# Patient Record
Sex: Female | Born: 1965 | Race: White | Hispanic: No | Marital: Married | Smoking: Current every day smoker
Health system: Southern US, Community
[De-identification: ages and names within clinical notes are randomized; demographics above are authoritative.]

## PROBLEM LIST (undated history)

## (undated) DIAGNOSIS — I10 Essential (primary) hypertension: Secondary | ICD-10-CM

## (undated) DIAGNOSIS — M67439 Ganglion, unspecified wrist: Secondary | ICD-10-CM

## (undated) DIAGNOSIS — E039 Hypothyroidism, unspecified: Secondary | ICD-10-CM

## (undated) DIAGNOSIS — M1990 Unspecified osteoarthritis, unspecified site: Secondary | ICD-10-CM

## (undated) DIAGNOSIS — D497 Neoplasm of unspecified behavior of endocrine glands and other parts of nervous system: Secondary | ICD-10-CM

## (undated) DIAGNOSIS — F32A Depression, unspecified: Secondary | ICD-10-CM

## (undated) DIAGNOSIS — K219 Gastro-esophageal reflux disease without esophagitis: Secondary | ICD-10-CM

## (undated) DIAGNOSIS — R0600 Dyspnea, unspecified: Secondary | ICD-10-CM

## (undated) DIAGNOSIS — F419 Anxiety disorder, unspecified: Secondary | ICD-10-CM

## (undated) HISTORY — PX: BREAST SURGERY: SHX581

## (undated) HISTORY — PX: BREAST BIOPSY: SHX20

---

## 1993-07-23 HISTORY — PX: CHOLECYSTECTOMY: SHX55

## 1996-07-23 HISTORY — PX: TUBAL LIGATION: SHX77

## 2013-08-14 ENCOUNTER — Other Ambulatory Visit: Payer: Self-pay | Admitting: Neurosurgery

## 2013-08-17 ENCOUNTER — Encounter (HOSPITAL_COMMUNITY): Payer: Self-pay | Admitting: Pharmacy Technician

## 2013-08-18 NOTE — Pre-Procedure Instructions (Signed)
[name]  08/18/2013   Your procedure is scheduled on:  Jan. 30th, Friday   Report to [place]  2 * 3 at 5:30 AM.  Call this number if you have problems the morning of surgery: [phone]   Remember:   Do not eat food or drink liquids after midnight Thursday.   Take these medicines the morning of surgery with A SIP OF WATER: hydrocodone   Do not wear jewelry, make-up or nail polish.  Do not wear lotions, powders, or perfumes. You may NOT wear deodorant.  Do not shave underarms & legs 48 hours prior to surgery.    Do not bring valuables to the hospital.  [facility] is not responsible for any belongings or valuables.               Contacts, dentures or bridgework may not be worn into surgery.  Leave suitcase in the car. After surgery it may be brought to your room.  For patients admitted to the hospital, discharge time is determined by your treatment team.              Name and phone number of your driver:     Special Instructions: Shower using CHG 2 nights before surgery and the night before surgery.  If you shower the day of surgery use CHG.  Use special wash - you have one bottle of CHG for all showers.  You should use approximately 1/3 of the bottle for each shower.   Please read over the following fact sheets that you were given: Pain Booklet, Coughing and Deep Breathing and Surgical Site Infection Prevention

## 2013-08-19 ENCOUNTER — Encounter (HOSPITAL_COMMUNITY)
Admission: RE | Admit: 2013-08-19 | Discharge: 2013-08-19 | Disposition: A | Discharge status: Home / Self Care | Payer: BC Managed Care – PPO | Source: Ambulatory Visit | Attending: Anesthesiology | Admitting: Anesthesiology

## 2013-08-19 ENCOUNTER — Encounter (HOSPITAL_COMMUNITY)
Admission: RE | Admit: 2013-08-19 | Discharge: 2013-08-19 | Disposition: A | Discharge status: Home / Self Care | Payer: BC Managed Care – PPO | Source: Ambulatory Visit | Attending: Neurosurgery | Admitting: Neurosurgery

## 2013-08-19 ENCOUNTER — Encounter (HOSPITAL_COMMUNITY): Payer: Self-pay

## 2013-08-19 DIAGNOSIS — Z01810 Encounter for preprocedural cardiovascular examination: Secondary | ICD-10-CM | POA: Insufficient documentation

## 2013-08-19 DIAGNOSIS — Z01811 Encounter for preprocedural respiratory examination: Secondary | ICD-10-CM | POA: Insufficient documentation

## 2013-08-19 DIAGNOSIS — Z01812 Encounter for preprocedural laboratory examination: Secondary | ICD-10-CM | POA: Insufficient documentation

## 2013-08-19 DIAGNOSIS — Z01818 Encounter for other preprocedural examination: Secondary | ICD-10-CM | POA: Insufficient documentation

## 2013-08-19 HISTORY — DX: Ganglion, unspecified wrist: M67.439

## 2013-08-19 HISTORY — DX: Essential (primary) hypertension: I10

## 2013-08-19 HISTORY — DX: Neoplasm of unspecified behavior of endocrine glands and other parts of nervous system: D49.7

## 2013-08-19 LAB — HCG, SERUM, QUALITATIVE: PREG SERUM: NEGATIVE

## 2013-08-19 LAB — SURGICAL PCR SCREEN
MRSA, PCR: NEGATIVE
Staphylococcus aureus: POSITIVE — AB

## 2013-08-19 LAB — CBC WITH DIFFERENTIAL/PLATELET
BASOS ABS: 0.1 10*3/uL (ref 0.0–0.1)
Basophils Relative: 1 % (ref 0–1)
Eosinophils Absolute: 0.1 10*3/uL (ref 0.0–0.7)
Eosinophils Relative: 1 % (ref 0–5)
HCT: 44.4 % (ref 36.0–46.0)
Hemoglobin: 15.2 g/dL — ABNORMAL HIGH (ref 12.0–15.0)
Lymphocytes Relative: 23 % (ref 12–46)
Lymphs Abs: 2.3 10*3/uL (ref 0.7–4.0)
MCH: 30.3 pg (ref 26.0–34.0)
MCHC: 34.2 g/dL (ref 30.0–36.0)
MCV: 88.6 fL (ref 78.0–100.0)
MONO ABS: 0.6 10*3/uL (ref 0.1–1.0)
Monocytes Relative: 6 % (ref 3–12)
Neutro Abs: 6.7 10*3/uL (ref 1.7–7.7)
Neutrophils Relative %: 69 % (ref 43–77)
Platelets: 262 10*3/uL (ref 150–400)
RBC: 5.01 MIL/uL (ref 3.87–5.11)
RDW: 12.6 % (ref 11.5–15.5)
WBC: 9.7 10*3/uL (ref 4.0–10.5)

## 2013-08-19 LAB — BASIC METABOLIC PANEL
BUN: 5 mg/dL — ABNORMAL LOW (ref 6–23)
CHLORIDE: 102 meq/L (ref 96–112)
CO2: 28 mEq/L (ref 19–32)
CREATININE: 0.64 mg/dL (ref 0.50–1.10)
Calcium: 9.2 mg/dL (ref 8.4–10.5)
GFR calc non Af Amer: >90 mL/min (ref >90)
Glucose, Bld: 81 mg/dL (ref 70–99)
POTASSIUM: 4.8 meq/L (ref 3.7–5.3)
Sodium: 141 mEq/L (ref 137–147)

## 2013-08-19 NOTE — Progress Notes (Addendum)
Anesthesia Chart Review:  Patient is a 48 year old female scheduled for L4-5 laminectomy for intradural tumor on 08/21/13 by Dr. Annette Stable.  Other history includes smoking, cholecystectomy, benign breast biopsy, tubal ligation, HTN in pregnancy.  She does not see a PCP on a regular basis, but has been seen at [facility] in [place] within the past three years.  Her BP at PAT was consistently elevated 170/100 - 185/118. Dr. Rupert Stacks office notified and he will see her tomorrow at 2 PM.  Lorriane Shire at Dr. Marchelle Folks office notified.   Preoperative CXR and labs noted. EKG on 08/19/13 showed NSR, possible LAE.    Plans to proceed with depend on Dr. Rupert Stacks input and BP control.    George Hugh [facility] Short Stay Center/Anesthesiology Phone [phone] 08/19/2013 6:34 PM  Addendum: 08/20/2013 4:55 PM I spoke with Lorriane Shire earlier today.  Dr. Annette Stable is aware of patient's elevated BP at PAT and wants to keep case as scheduled with plans to re-evaluate on arrival.  In addition, I just received a fax from [place] from Dr. Rupert Stacks office.  Note states, "I have spoken with Dr. Annette Stable. Cleared for non-cardiac surgery.  New onset HTN--no treatment started at this time.  Suspect surgery to remove spinal mass will resolve. F/U after surgery."

## 2013-08-19 NOTE — Progress Notes (Signed)
Appointment made 08/20/1013 @ 2pm with Dr Lonzo Cloud in [place]; for evaluation of high blood pressure and surgical clearance. [facility] @ [facility]  office informed. Shelby Dubin., PA aware.

## 2013-08-20 MED ORDER — CEFAZOLIN SODIUM-DEXTROSE 2-3 GM-% IV SOLR
2.0000 g | INTRAVENOUS | Status: AC
  Administered 2013-08-21: 2 g via INTRAVENOUS
  Filled 2013-08-20: qty 50

## 2013-08-21 ENCOUNTER — Inpatient Hospital Stay (HOSPITAL_COMMUNITY): Payer: BC Managed Care – PPO

## 2013-08-21 ENCOUNTER — Encounter (HOSPITAL_COMMUNITY): Payer: BC Managed Care – PPO | Admitting: Vascular Surgery

## 2013-08-21 ENCOUNTER — Encounter (HOSPITAL_COMMUNITY): Payer: Self-pay | Admitting: *Deleted

## 2013-08-21 ENCOUNTER — Inpatient Hospital Stay (HOSPITAL_COMMUNITY): Payer: BC Managed Care – PPO | Admitting: Anesthesiology

## 2013-08-21 ENCOUNTER — Inpatient Hospital Stay (HOSPITAL_COMMUNITY)
Admission: RE | Admit: 2013-08-21 | Discharge: 2013-08-25 | DRG: 029 | Disposition: A | Discharge status: Home / Self Care | Payer: BC Managed Care – PPO | Source: Ambulatory Visit | Attending: Neurosurgery | Admitting: Neurosurgery

## 2013-08-21 ENCOUNTER — Encounter (HOSPITAL_COMMUNITY)
Admission: RE | Disposition: A | Discharge status: Home / Self Care | Payer: Self-pay | Source: Ambulatory Visit | Attending: Neurosurgery

## 2013-08-21 DIAGNOSIS — Z79899 Other long term (current) drug therapy: Secondary | ICD-10-CM

## 2013-08-21 DIAGNOSIS — Z01818 Encounter for other preprocedural examination: Secondary | ICD-10-CM

## 2013-08-21 DIAGNOSIS — F17200 Nicotine dependence, unspecified, uncomplicated: Secondary | ICD-10-CM | POA: Diagnosis present

## 2013-08-21 DIAGNOSIS — Z01812 Encounter for preprocedural laboratory examination: Secondary | ICD-10-CM

## 2013-08-21 DIAGNOSIS — D497 Neoplasm of unspecified behavior of endocrine glands and other parts of nervous system: Secondary | ICD-10-CM | POA: Diagnosis present

## 2013-08-21 DIAGNOSIS — Z01811 Encounter for preprocedural respiratory examination: Secondary | ICD-10-CM

## 2013-08-21 DIAGNOSIS — R32 Unspecified urinary incontinence: Secondary | ICD-10-CM | POA: Diagnosis present

## 2013-08-21 DIAGNOSIS — Z01810 Encounter for preprocedural cardiovascular examination: Secondary | ICD-10-CM

## 2013-08-21 DIAGNOSIS — G834 Cauda equina syndrome: Secondary | ICD-10-CM | POA: Diagnosis present

## 2013-08-21 DIAGNOSIS — D334 Benign neoplasm of spinal cord: Principal | ICD-10-CM | POA: Diagnosis present

## 2013-08-21 DIAGNOSIS — I10 Essential (primary) hypertension: Secondary | ICD-10-CM | POA: Diagnosis present

## 2013-08-21 DIAGNOSIS — IMO0002 Reserved for concepts with insufficient information to code with codable children: Secondary | ICD-10-CM

## 2013-08-21 HISTORY — PX: LAMINECTOMY: SHX219

## 2013-08-21 SURGERY — LUMBAR LAMINECTOMY FOR TUMOR
Anesthesia: General | Site: Back

## 2013-08-21 MED ORDER — STERILE WATER FOR INJECTION IJ SOLN
INTRAMUSCULAR | Status: AC
  Filled 2013-08-21: qty 10

## 2013-08-21 MED ORDER — ALUM & MAG HYDROXIDE-SIMETH 200-200-20 MG/5ML PO SUSP: 30.0000 mL | Freq: Four times a day (QID) | ORAL | Status: DC | PRN

## 2013-08-21 MED ORDER — MIDAZOLAM HCL 5 MG/5ML IJ SOLN
INTRAMUSCULAR | Status: DC | PRN
  Administered 2013-08-21: 2 mg via INTRAVENOUS

## 2013-08-21 MED ORDER — HYDROCODONE-ACETAMINOPHEN 5-325 MG PO TABS
1.0000 | ORAL_TABLET | ORAL | Status: DC | PRN
  Administered 2013-08-25: 2 via ORAL
  Filled 2013-08-21: qty 2

## 2013-08-21 MED ORDER — METOCLOPRAMIDE HCL 5 MG/ML IJ SOLN: 10.0000 mg | Freq: Once | INTRAMUSCULAR | Status: DC | PRN

## 2013-08-21 MED ORDER — SUFENTANIL CITRATE 50 MCG/ML IV SOLN
INTRAVENOUS | Status: DC | PRN
  Administered 2013-08-21: 10 ug via INTRAVENOUS
  Administered 2013-08-21: 20 ug via INTRAVENOUS

## 2013-08-21 MED ORDER — HYDROMORPHONE HCL PF 1 MG/ML IJ SOLN
0.2500 mg | INTRAMUSCULAR | Status: DC | PRN
  Administered 2013-08-21 (×2): 0.5 mg via INTRAVENOUS

## 2013-08-21 MED ORDER — 0.9 % SODIUM CHLORIDE (POUR BTL) OPTIME
TOPICAL | Status: DC | PRN
  Administered 2013-08-21: 1000 mL

## 2013-08-21 MED ORDER — HYDROMORPHONE HCL PF 1 MG/ML IJ SOLN
0.5000 mg | INTRAMUSCULAR | Status: DC | PRN
  Administered 2013-08-21: 1 mg via INTRAVENOUS
  Filled 2013-08-21: qty 1

## 2013-08-21 MED ORDER — SENNA 8.6 MG PO TABS
1.0000 | ORAL_TABLET | Freq: Two times a day (BID) | ORAL | Status: DC
  Administered 2013-08-21 – 2013-08-25 (×7): 8.6 mg via ORAL
  Filled 2013-08-21 (×9): qty 1

## 2013-08-21 MED ORDER — DEXAMETHASONE SODIUM PHOSPHATE 10 MG/ML IJ SOLN: 10.0000 mg | INTRAMUSCULAR | Status: DC

## 2013-08-21 MED ORDER — THROMBIN 20000 UNITS EX SOLR
CUTANEOUS | Status: DC | PRN
  Administered 2013-08-21: 09:00:00 via TOPICAL

## 2013-08-21 MED ORDER — SODIUM CHLORIDE 0.9 % IJ SOLN: 3.0000 mL | INTRAMUSCULAR | Status: DC | PRN

## 2013-08-21 MED ORDER — ONDANSETRON HCL 4 MG/2ML IJ SOLN
INTRAMUSCULAR | Status: AC
  Filled 2013-08-21: qty 2

## 2013-08-21 MED ORDER — OXYCODONE HCL 5 MG PO TABS
ORAL_TABLET | ORAL | Status: AC
  Filled 2013-08-21: qty 1

## 2013-08-21 MED ORDER — BISACODYL 10 MG RE SUPP: 10.0000 mg | Freq: Every day | RECTAL | Status: DC | PRN

## 2013-08-21 MED ORDER — OXYCODONE-ACETAMINOPHEN 5-325 MG PO TABS
1.0000 | ORAL_TABLET | ORAL | Status: DC | PRN
  Administered 2013-08-22 (×2): 1 via ORAL
  Administered 2013-08-23 – 2013-08-24 (×2): 2 via ORAL
  Filled 2013-08-21 (×3): qty 1
  Filled 2013-08-21 (×2): qty 2

## 2013-08-21 MED ORDER — PHENOL 1.4 % MT LIQD: 1.0000 | OROMUCOSAL | Status: DC | PRN

## 2013-08-21 MED ORDER — MENTHOL 3 MG MT LOZG: 1.0000 | LOZENGE | OROMUCOSAL | Status: DC | PRN

## 2013-08-21 MED ORDER — SODIUM CHLORIDE 0.9 % IV SOLN: 250.0000 mL | INTRAVENOUS | Status: DC

## 2013-08-21 MED ORDER — CYCLOBENZAPRINE HCL 10 MG PO TABS: 10.0000 mg | ORAL_TABLET | Freq: Three times a day (TID) | ORAL | Status: DC | PRN

## 2013-08-21 MED ORDER — SUCCINYLCHOLINE CHLORIDE 20 MG/ML IJ SOLN
INTRAMUSCULAR | Status: AC
  Filled 2013-08-21: qty 1

## 2013-08-21 MED ORDER — SODIUM CHLORIDE 0.9 % IR SOLN
Status: DC | PRN
  Administered 2013-08-21: 09:00:00

## 2013-08-21 MED ORDER — KETOROLAC TROMETHAMINE 30 MG/ML IJ SOLN
INTRAMUSCULAR | Status: AC
  Filled 2013-08-21: qty 1

## 2013-08-21 MED ORDER — PROPOFOL 10 MG/ML IV BOLUS
INTRAVENOUS | Status: DC | PRN
  Administered 2013-08-21: 200 mg via INTRAVENOUS

## 2013-08-21 MED ORDER — ONDANSETRON HCL 4 MG/2ML IJ SOLN
4.0000 mg | INTRAMUSCULAR | Status: DC | PRN
  Administered 2013-08-21 – 2013-08-22 (×3): 4 mg via INTRAVENOUS
  Filled 2013-08-21 (×3): qty 2

## 2013-08-21 MED ORDER — SUFENTANIL CITRATE 50 MCG/ML IV SOLN
INTRAVENOUS | Status: AC
  Filled 2013-08-21: qty 1

## 2013-08-21 MED ORDER — KETOROLAC TROMETHAMINE 30 MG/ML IJ SOLN
30.0000 mg | Freq: Four times a day (QID) | INTRAMUSCULAR | Status: DC
  Administered 2013-08-21 – 2013-08-25 (×15): 30 mg via INTRAVENOUS
  Filled 2013-08-21 (×23): qty 1

## 2013-08-21 MED ORDER — ROCURONIUM BROMIDE 50 MG/5ML IV SOLN
INTRAVENOUS | Status: AC
  Filled 2013-08-21: qty 1

## 2013-08-21 MED ORDER — PROPOFOL 10 MG/ML IV BOLUS
INTRAVENOUS | Status: AC
  Filled 2013-08-21: qty 20

## 2013-08-21 MED ORDER — OXYCODONE HCL 5 MG PO TABS
5.0000 mg | ORAL_TABLET | Freq: Once | ORAL | Status: AC | PRN
  Administered 2013-08-21: 5 mg via ORAL

## 2013-08-21 MED ORDER — DEXAMETHASONE SODIUM PHOSPHATE 10 MG/ML IJ SOLN
INTRAMUSCULAR | Status: AC
  Filled 2013-08-21: qty 1

## 2013-08-21 MED ORDER — BUPIVACAINE HCL (PF) 0.25 % IJ SOLN
INTRAMUSCULAR | Status: DC | PRN
  Administered 2013-08-21: 30 mL

## 2013-08-21 MED ORDER — PHENYLEPHRINE 40 MCG/ML (10ML) SYRINGE FOR IV PUSH (FOR BLOOD PRESSURE SUPPORT)
PREFILLED_SYRINGE | INTRAVENOUS | Status: AC
  Filled 2013-08-21: qty 10

## 2013-08-21 MED ORDER — LACTATED RINGERS IV SOLN
INTRAVENOUS | Status: DC | PRN
  Administered 2013-08-21 (×2): via INTRAVENOUS

## 2013-08-21 MED ORDER — ACETAMINOPHEN 650 MG RE SUPP: 650.0000 mg | RECTAL | Status: DC | PRN

## 2013-08-21 MED ORDER — POLYETHYLENE GLYCOL 3350 17 G PO PACK
17.0000 g | PACK | Freq: Every day | ORAL | Status: DC | PRN
  Filled 2013-08-21: qty 1

## 2013-08-21 MED ORDER — OXYCODONE HCL 5 MG/5ML PO SOLN: 5.0000 mg | Freq: Once | ORAL | Status: AC | PRN

## 2013-08-21 MED ORDER — KETOROLAC TROMETHAMINE 30 MG/ML IJ SOLN
INTRAMUSCULAR | Status: DC | PRN
  Administered 2013-08-21: 30 mg via INTRAVENOUS

## 2013-08-21 MED ORDER — SODIUM CHLORIDE 0.9 % IJ SOLN
3.0000 mL | Freq: Two times a day (BID) | INTRAMUSCULAR | Status: DC
  Administered 2013-08-21 – 2013-08-24 (×2): 3 mL via INTRAVENOUS

## 2013-08-21 MED ORDER — ONDANSETRON HCL 4 MG/2ML IJ SOLN
INTRAMUSCULAR | Status: DC | PRN
Start: 2013-08-21 — End: 2013-08-21
  Administered 2013-08-21: 4 mg via INTRAVENOUS

## 2013-08-21 MED ORDER — MIDAZOLAM HCL 2 MG/2ML IJ SOLN
INTRAMUSCULAR | Status: AC
  Filled 2013-08-21: qty 2

## 2013-08-21 MED ORDER — CEFAZOLIN SODIUM 1-5 GM-% IV SOLN
1.0000 g | Freq: Three times a day (TID) | INTRAVENOUS | Status: AC
  Administered 2013-08-21 – 2013-08-22 (×2): 1 g via INTRAVENOUS
  Filled 2013-08-21 (×3): qty 50

## 2013-08-21 MED ORDER — LIDOCAINE HCL (CARDIAC) 20 MG/ML IV SOLN
INTRAVENOUS | Status: AC
  Filled 2013-08-21: qty 5

## 2013-08-21 MED ORDER — ACETAMINOPHEN 325 MG PO TABS
650.0000 mg | ORAL_TABLET | ORAL | Status: DC | PRN
  Administered 2013-08-21: 650 mg via ORAL
  Filled 2013-08-21: qty 2

## 2013-08-21 MED ORDER — FLEET ENEMA 7-19 GM/118ML RE ENEM: 1.0000 | ENEMA | Freq: Once | RECTAL | Status: AC | PRN

## 2013-08-21 MED ORDER — HYDROMORPHONE HCL PF 1 MG/ML IJ SOLN
INTRAMUSCULAR | Status: AC
  Filled 2013-08-21: qty 1

## 2013-08-21 MED ORDER — ROCURONIUM BROMIDE 100 MG/10ML IV SOLN
INTRAVENOUS | Status: DC | PRN
  Administered 2013-08-21: 50 mg via INTRAVENOUS

## 2013-08-21 MED ORDER — LIDOCAINE HCL (CARDIAC) 20 MG/ML IV SOLN
INTRAVENOUS | Status: DC | PRN
  Administered 2013-08-21: 40 mg via INTRAVENOUS

## 2013-08-21 MED ORDER — POTASSIUM CHLORIDE IN NACL 20-0.9 MEQ/L-% IV SOLN
INTRAVENOUS | Status: DC
  Administered 2013-08-21 – 2013-08-23 (×5): via INTRAVENOUS
  Filled 2013-08-21 (×11): qty 1000

## 2013-08-21 SURGICAL SUPPLY — 79 items
BAG DECANTER FOR FLEXI CONT (MISCELLANEOUS) ×3 IMPLANT
BENZOIN TINCTURE PRP APPL 2/3 (GAUZE/BANDAGES/DRESSINGS) ×3 IMPLANT
BLADE SURG 11 STRL SS (BLADE) IMPLANT
BLADE SURG ROTATE 9660 (MISCELLANEOUS) IMPLANT
BRUSH SCRUB EZ PLAIN DRY (MISCELLANEOUS) ×3 IMPLANT
BUR CUTTER 7.0 ROUND (BURR) IMPLANT
BUR MATCHSTICK NEURO 3.0 LAGG (BURR) IMPLANT
CANISTER SUCT 3000ML (MISCELLANEOUS) ×3 IMPLANT
CLOSURE WOUND 1/2 X4 (GAUZE/BANDAGES/DRESSINGS) ×1
CONT SPEC 4OZ CLIKSEAL STRL BL (MISCELLANEOUS) ×3 IMPLANT
DERMABOND ADHESIVE PROPEN (GAUZE/BANDAGES/DRESSINGS) ×2
DERMABOND ADVANCED (GAUZE/BANDAGES/DRESSINGS) ×2
DERMABOND ADVANCED .7 DNX12 (GAUZE/BANDAGES/DRESSINGS) ×1 IMPLANT
DERMABOND ADVANCED .7 DNX6 (GAUZE/BANDAGES/DRESSINGS) ×1 IMPLANT
DRAPE LAPAROTOMY 100X72X124 (DRAPES) ×3 IMPLANT
DRAPE LAPAROTOMY T 102X78X121 (DRAPES) IMPLANT
DRAPE MICROSCOPE ZEISS OPMI (DRAPES) ×3 IMPLANT
DRAPE POUCH INSTRU U-SHP 10X18 (DRAPES) ×3 IMPLANT
DRAPE SURG 17X23 STRL (DRAPES) ×6 IMPLANT
DRSG OPSITE POSTOP 4X6 (GAUZE/BANDAGES/DRESSINGS) ×3 IMPLANT
DURAPREP 26ML APPLICATOR (WOUND CARE) ×3 IMPLANT
DURASEAL APPLICATOR TIP (TIP) ×3 IMPLANT
DURASEAL SPINE SEALANT 3ML (MISCELLANEOUS) ×3 IMPLANT
ELECT REM PT RETURN 9FT ADLT (ELECTROSURGICAL) ×3
ELECTRODE REM PT RTRN 9FT ADLT (ELECTROSURGICAL) ×1 IMPLANT
GAUZE SPONGE 4X4 16PLY XRAY LF (GAUZE/BANDAGES/DRESSINGS) IMPLANT
GLOVE BIOGEL M 8.0 STRL (GLOVE) ×3 IMPLANT
GLOVE BIOGEL PI IND STRL 7.0 (GLOVE) ×2 IMPLANT
GLOVE BIOGEL PI INDICATOR 7.0 (GLOVE) ×4
GLOVE ECLIPSE 8.5 STRL (GLOVE) ×3 IMPLANT
GLOVE EXAM NITRILE LRG STRL (GLOVE) IMPLANT
GLOVE EXAM NITRILE MD LF STRL (GLOVE) IMPLANT
GLOVE EXAM NITRILE XL STR (GLOVE) IMPLANT
GLOVE EXAM NITRILE XS STR PU (GLOVE) IMPLANT
GLOVE SS N UNI LF 7.0 STRL (GLOVE) ×12 IMPLANT
GLOVE SURG SS PI 8.5 STRL IVOR (GLOVE) ×2
GLOVE SURG SS PI 8.5 STRL STRW (GLOVE) ×1 IMPLANT
GOWN BRE IMP SLV AUR LG STRL (GOWN DISPOSABLE) IMPLANT
GOWN BRE IMP SLV AUR XL STRL (GOWN DISPOSABLE) IMPLANT
GOWN STRL REIN 2XL LVL4 (GOWN DISPOSABLE) IMPLANT
GOWN STRL REUS W/ TWL XL LVL3 (GOWN DISPOSABLE) ×3 IMPLANT
GOWN STRL REUS W/TWL XL LVL3 (GOWN DISPOSABLE) ×6
HEMOSTAT SURGICEL 2X14 (HEMOSTASIS) IMPLANT
KIT BASIN OR (CUSTOM PROCEDURE TRAY) ×3 IMPLANT
KIT ROOM TURNOVER OR (KITS) ×3 IMPLANT
NEEDLE HYPO 22GX1.5 SAFETY (NEEDLE) ×3 IMPLANT
NEEDLE HYPO 25X1 1.5 SAFETY (NEEDLE) ×3 IMPLANT
NEEDLE SPNL 20GX3.5 QUINCKE YW (NEEDLE) IMPLANT
NS IRRIG 1000ML POUR BTL (IV SOLUTION) ×3 IMPLANT
PACK LAMINECTOMY NEURO (CUSTOM PROCEDURE TRAY) ×3 IMPLANT
PAD EYE OVAL STERILE LF (GAUZE/BANDAGES/DRESSINGS) IMPLANT
PATTIES SURGICAL .5 X.5 (GAUZE/BANDAGES/DRESSINGS) IMPLANT
PATTIES SURGICAL .5 X1 (DISPOSABLE) ×3 IMPLANT
PATTIES SURGICAL .5 X3 (DISPOSABLE) ×3 IMPLANT
RUBBERBAND STERILE (MISCELLANEOUS) ×6 IMPLANT
SPONGE GAUZE 4X4 12PLY (GAUZE/BANDAGES/DRESSINGS) ×3 IMPLANT
SPONGE LAP 4X18 X RAY DECT (DISPOSABLE) IMPLANT
SPONGE SURGIFOAM ABS GEL 100 (HEMOSTASIS) IMPLANT
STAPLER VISISTAT 35W (STAPLE) ×3 IMPLANT
STRIP CLOSURE SKIN 1/2X4 (GAUZE/BANDAGES/DRESSINGS) ×2 IMPLANT
SUT BONE WAX W31G (SUTURE) IMPLANT
SUT ETHILON 3 0 FSL (SUTURE) ×3 IMPLANT
SUT ETHILON 4 0 PS 2 18 (SUTURE) ×3 IMPLANT
SUT NURALON 4 0 TR CR/8 (SUTURE) ×3 IMPLANT
SUT PROLENE 5 0 C1 (SUTURE) ×6 IMPLANT
SUT PROLENE 6 0 BV (SUTURE) IMPLANT
SUT VIC AB 0 CT1 18XCR BRD8 (SUTURE) IMPLANT
SUT VIC AB 0 CT1 8-18 (SUTURE)
SUT VIC AB 2-0 CT1 18 (SUTURE) ×3 IMPLANT
SUT VIC AB 3-0 SH 8-18 (SUTURE) IMPLANT
SUT VICRYL 4-0 PS2 18IN ABS (SUTURE) IMPLANT
SYR 20ML ECCENTRIC (SYRINGE) IMPLANT
TAPE STRIPS DRAPE STRL (GAUZE/BANDAGES/DRESSINGS) ×3 IMPLANT
TOWEL OR 17X24 6PK STRL BLUE (TOWEL DISPOSABLE) ×3 IMPLANT
TOWEL OR 17X26 10 PK STRL BLUE (TOWEL DISPOSABLE) ×3 IMPLANT
TRAY FOLEY CATH 14FRSI W/METER (CATHETERS) IMPLANT
TUBE CONNECTING 12'X1/4 (SUCTIONS)
TUBE CONNECTING 12X1/4 (SUCTIONS) IMPLANT
WATER STERILE IRR 1000ML POUR (IV SOLUTION) ×3 IMPLANT

## 2013-08-21 NOTE — Progress Notes (Signed)
   CARE MANAGEMENT NOTE 08/21/2013  Patient:  Chelsey Hoffman, Chelsey Hoffman   Account Number:  [id number]  Date Initiated:  08/21/2013  Documentation initiated by:  Olga Coaster  Subjective/Objective Assessment:   ADMITTED FOR SURGERY     Action/Plan:   CM FOLLOWING FOR DCP   Anticipated DC Date:  08/28/2013   Anticipated DC Plan:  AWAITING FOR PT/OT EVAL FOR DISPOSITION NEEDS     DC Planning Services  CM consult          Status of service:  In process, will continue to follow Medicare Important Message given?  NA - LOS <3 / Initial given by admissions (If response is "NO", the following Medicare IM given date fields will be blank)  Per UR Regulation:  Reviewed for med. necessity/level of care/duration of stay  Comments:  1/30/2015Mindi Slicker RN,BSN,MHA [phone]

## 2013-08-21 NOTE — Preoperative (Signed)
Beta Blockers   Reason not to administer Beta Blockers:Not Applicable 

## 2013-08-21 NOTE — H&P (Signed)
Chelsey Hoffman is an 48 y.o. female.   Chief Complaint: Back and leg pain HPI: 48 year old otherwise healthy female with progressive back and bilateral lower extremity pain and numbness right greater than left. Patient with some urinary urgency and occasional incontinence which is new. 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 Patient presents now for laminectomy and resection of tumor.  Past Medical History  Diagnosis Date  . Spinal cord tumor   . Ganglion cyst of wrist   . Hypertension     gestational    Past Surgical History  Procedure Laterality Date  . Cholecystectomy  1995  . Tubal ligation  1998  . Breast surgery    . Breast biopsy Right     benign    History reviewed. No pertinent family history. Social History:  reports that she has been smoking Cigarettes.  She has a 15 pack-year smoking history. She has never used smokeless tobacco. She reports that she does not drink alcohol or use illicit drugs.  Allergies: No Known Allergies  Medications Prior to Admission  Medication Sig Dispense Refill  . acetaminophen (TYLENOL) 325 MG tablet Take 650 mg by mouth every 6 (six) hours as needed for mild pain.      Marland Kitchen HYDROcodone-acetaminophen (NORCO/VICODIN) 5-325 MG per tablet Take 1 tablet by mouth every 6 (six) hours as needed for moderate pain.      . meloxicam (MOBIC) 15 MG tablet Take 15 mg by mouth daily. Not taking      . predniSONE (DELTASONE) 5 MG tablet Take 5 mg by mouth daily with breakfast. Take 6 tablets on day 1 and decrease by 1 tablet each day until finished        Results for orders placed during the hospital encounter of 08/19/13 (from the past 48 hour(s))  BASIC METABOLIC PANEL     Status: Abnormal   Collection Time    08/19/13  9:53 AM      Result Value Range   Sodium 141  137 - 147 mEq/L   Potassium  4.8  3.7 - 5.3 mEq/L   Chloride 102  96 - 112 mEq/L   CO2 28  19 - 32 mEq/L   Glucose, Bld 81  70 - 99 mg/dL   BUN 5 (*) 6 - 23 mg/dL   Creatinine, Ser 0.64  0.50 - 1.10 mg/dL   Calcium 9.2  8.4 - 10.5 mg/dL   GFR calc non Af Amer >90  >90 mL/min   GFR calc Af Amer >90  >90 mL/min   Comment: (NOTE)     The eGFR has been calculated using the CKD EPI equation.     This calculation has not been validated in all clinical situations.     eGFR's persistently <90 mL/min signify possible Chronic Kidney     Disease.  CBC WITH DIFFERENTIAL     Status: Abnormal   Collection Time    08/19/13  9:53 AM      Result Value Range   WBC 9.7  4.0 - 10.5 K/uL   RBC 5.01  3.87 - 5.11 MIL/uL   Hemoglobin 15.2 (*) 12.0 - 15.0 g/dL   HCT 44.4  36.0 - 46.0 %   MCV 88.6  78.0 - 100.0 fL   MCH 30.3  26.0 - 34.0 pg   MCHC 34.2  30.0 - 36.0 g/dL   RDW  12.6  11.5 - 15.5 %   Platelets 262  150 - 400 K/uL   Neutrophils Relative % 69  43 - 77 %   Neutro Abs 6.7  1.7 - 7.7 K/uL   Lymphocytes Relative 23  12 - 46 %   Lymphs Abs 2.3  0.7 - 4.0 K/uL   Monocytes Relative 6  3 - 12 %   Monocytes Absolute 0.6  0.1 - 1.0 K/uL   Eosinophils Relative 1  0 - 5 %   Eosinophils Absolute 0.1  0.0 - 0.7 K/uL   Basophils Relative 1  0 - 1 %   Basophils Absolute 0.1  0.0 - 0.1 K/uL  HCG, SERUM, QUALITATIVE     Status: None   Collection Time    08/19/13  9:53 AM      Result Value Range   Preg, Serum NEGATIVE  NEGATIVE   Comment:            THE SENSITIVITY OF THIS     METHODOLOGY IS >10 mIU/mL.  SURGICAL PCR SCREEN     Status: Abnormal   Collection Time    08/19/13 10:02 AM      Result Value Range   MRSA, PCR NEGATIVE  NEGATIVE   Staphylococcus aureus POSITIVE (*) NEGATIVE   Comment:            The Xpert SA Assay (FDA     approved for NASAL specimens     in patients over 47 years of age),     is one component of     a comprehensive surveillance     program.  Test performance has     been validated by [employer] for patients greater     than or equal to 47 year old.     It is not intended     to diagnose infection nor to     guide or monitor treatment.   Dg Chest 2 View  08/19/2013   CLINICAL DATA:  Preop lumbar laminectomy  EXAM: CHEST  2 VIEW  COMPARISON:  None.  FINDINGS: The heart size and mediastinal contours are within normal limits. Both lungs are clear. The visualized skeletal structures are unremarkable.  IMPRESSION: No active cardiopulmonary disease.   Electronically Signed   By: Franchot Gallo M.D.   On: 08/19/2013 13:43    Review of Systems  Constitutional: Negative.   HENT: Negative.   Eyes: Negative.   Respiratory: Negative.   Cardiovascular: Negative.   Gastrointestinal: Negative.   Genitourinary: Negative.   Musculoskeletal: Negative.   Skin: Negative.   Neurological: Negative.   Endo/Heme/Allergies: Negative.   Psychiatric/Behavioral: Negative.     Blood pressure 179/100, temperature 98.3 F (36.8 C), temperature source Oral, resp. rate 18, last menstrual period 03/21/2013, SpO2 100.00%. Physical Exam  Constitutional: She is oriented to person, place, and time. She appears well-developed and well-nourished. She appears distressed.  HENT:  Head: Normocephalic and atraumatic.  Right Ear: External ear normal.  Left Ear: External ear normal.  Nose: Nose normal.  Mouth/Throat: Oropharynx is clear and moist. No oropharyngeal exudate.  Eyes: Conjunctivae and EOM are normal. Pupils are equal, round, and reactive to light. Right eye exhibits no discharge. Left eye exhibits no discharge.  Neck: Normal range of motion. Neck supple. No tracheal deviation present. No thyromegaly present.  Cardiovascular: Normal rate, regular rhythm and intact distal pulses.  Exam reveals no friction rub.   No murmur heard. Respiratory: Effort normal and  breath sounds normal. No respiratory distress. She has no wheezes.  GI: Soft. Bowel sounds are normal. She exhibits no distension. There is  no tenderness.  Musculoskeletal: Normal range of motion. She exhibits no edema and no tenderness.  Neurological: She is alert and oriented to person, place, and time. She has normal reflexes. She displays normal reflexes. No cranial nerve deficit. She exhibits normal muscle tone. Coordination normal.  Skin: Skin is warm and dry. No rash noted. She is not diaphoretic. No erythema. No pallor.  Psychiatric: She has a normal mood and affect. Her behavior is normal. Judgment and thought content normal.     Assessment/Plan Intradural extramedullary spinal tumor. Plan L4-5 laminectomy and resection of tumor. Risks and benefits been explained. Patient wishes to proceed.  Quadry Kampa A 08/21/2013, 7:43 AM

## 2013-08-21 NOTE — Brief Op Note (Signed)
08/21/2013  9:50 AM  PATIENT:  Chelsey Hoffman  48 y.o. female  PRE-OPERATIVE DIAGNOSIS:  tumor  POST-OPERATIVE DIAGNOSIS:  tumor  PROCEDURE:  Procedure(s) with comments: LUMBAR LAMINECTOMY FOR TUMOR (N/A) - LUMBAR LAMINECTOMY FOR TUMOR  SURGEON:  Surgeon(s) and Role:    * Charlie Pitter, MD - Primary    * Elaina Hoops, MD - Assisting  PHYSICIAN ASSISTANT:   ASSISTANTS:    ANESTHESIA:   general  EBL:  Total I/O In: 1000 [I.V.:1000] Out: 275 [Urine:200; Blood:75]  BLOOD ADMINISTERED:none  DRAINS: none   LOCAL MEDICATIONS USED:  MARCAINE     SPECIMEN:  Source of Specimen:  Intradural extramedullary tumor  DISPOSITION OF SPECIMEN:  PATHOLOGY  COUNTS:  YES  TOURNIQUET:  * No tourniquets in log *  DICTATION: .Dragon Dictation  PLAN OF CARE: Admit to inpatient   PATIENT DISPOSITION:  PACU - hemodynamically stable.   Delay start of Pharmacological VTE agent (>24hrs) due to surgical blood loss or risk of bleeding: yes

## 2013-08-21 NOTE — Anesthesia Preprocedure Evaluation (Addendum)
Anesthesia Evaluation  Patient identified by MRN, date of birth, ID band Patient awake    Reviewed: Allergy & Precautions, H&P , NPO status , Patient's Chart, lab work & pertinent test results, reviewed documented beta blocker date and time   Airway Mallampati: II TM Distance: <3 FB Neck ROM: full    Dental  (+) Dental Advisory Given   Pulmonary Current Smoker,  breath sounds clear to auscultation        Cardiovascular hypertension, Rhythm:regular     Neuro/Psych negative neurological ROS  negative psych ROS   GI/Hepatic negative GI ROS, Neg liver ROS,   Endo/Other  negative endocrine ROS  Renal/GU negative Renal ROS  negative genitourinary   Musculoskeletal   Abdominal   Peds  Hematology negative hematology ROS (+)   Anesthesia Other Findings See surgeon's H&P   Reproductive/Obstetrics negative OB ROS                         Anesthesia Physical Anesthesia Plan  ASA: II  Anesthesia Plan: General   Post-op Pain Management:    Induction: Intravenous  Airway Management Planned: Oral ETT  Additional Equipment:   Intra-op Plan:   Post-operative Plan: Extubation in OR  Informed Consent: I have reviewed the patients History and Physical, chart, labs and discussed the procedure including the risks, benefits and alternatives for the proposed anesthesia with the patient or authorized representative who has indicated his/her understanding and acceptance.   Dental Advisory Given and Dental advisory given  Plan Discussed with: CRNA, Surgeon and Anesthesiologist  Anesthesia Plan Comments:        Anesthesia Quick Evaluation

## 2013-08-21 NOTE — Progress Notes (Signed)
Postop check.  Overall doing well. Back and leg pain much improved. No new symptoms of numbness or weakness. Incisional Pain well controlled. No other complaints.  Awake and alert. Oriented and appropriate. Motor and sensory function intact in both lower extremities. Dressing dry.  Doing well postop. Continue bedrest until Monday. Begin mobilization Monday morning.

## 2013-08-21 NOTE — Transfer of Care (Signed)
Immediate Anesthesia Transfer of Care Note  Patient: Chelsey Hoffman  Procedure(s) Performed: Procedure(s) with comments: LUMBAR LAMINECTOMY FOR TUMOR (N/A) - LUMBAR LAMINECTOMY FOR TUMOR  Patient Location: PACU  Anesthesia Type:General  Level of Consciousness: awake and oriented  Airway & Oxygen Therapy: Patient Spontanous Breathing and Patient connected to nasal cannula oxygen  Post-op Assessment: Report given to PACU RN, Post -op Vital signs reviewed and stable and Patient moving all extremities  Post vital signs: Reviewed and stable  Complications: No apparent anesthesia complications

## 2013-08-21 NOTE — Op Note (Signed)
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  Asst. Surgeon: Saintclair Halsted  Anesthesia: General  Indication: 48 year old female with progressive back pain weakness and sensory loss. 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 Plan is for laminectomy and resection of tumor.  Operative note: After induction anesthesia, patient positioned prone onto Wilson frame and appropriately padded. Lumbar region prepped and draped. Incision made overlying L4-5. Subperiosteal dissection performed bilaterally. Retractor placed. X-ray taken. Level confirmed. Complete laminectomy of L4 and L5 performed using Leksell rongeurs Kerrison rongeurs and a high-speed drill. Ligament flavum elevated and resected in piecemeal fashion. Underlying thecal sac and identified. Microscope brought into the field these were microdissection of the spinal canal and intradural space. Midline durotomy was made using a 15 blade. Dura was then opened cephalad and caudally. Retractor sutures were placed along the peripheral aspects of the dura. Microscope was then used to open the arachnoid using sharp dissection. Tumor was readily apparent. The tumor was dissected free from the surrounding arachnoid and capsule a shim. It was delivered into the field. The tumor itself was attached to I nerve root coming from what appeared to be the patient's right side. The nerve root was dissected free from the overlying mass. The tumor was removed in toto without evidence of any significant violation of the capsule. There is no evidence of hemorrhage  or other problems at this point. Wound is irrigated. Dura was reapproximated using 5-0 Nurolon in a running fashion. DuraSeal was placed over the dural repair. Gelfoam was placed over the laminectomy defect. Wounds and close in layers with Vicryl sutures and a running interlocking nylon suture at the skin. There were no apparent complications. Patient tolerated the procedure well and she returns to the recovery room postop.

## 2013-08-21 NOTE — Anesthesia Procedure Notes (Signed)
Procedure Name: Intubation Date/Time: 08/21/2013 7:51 AM Performed by: Melina Copa, Helder Crisafulli R Pre-anesthesia Checklist: Patient identified, Emergency Drugs available, Suction available, Patient being monitored and Timeout performed Patient Re-evaluated:Patient Re-evaluated prior to inductionOxygen Delivery Method: Circle system utilized Preoxygenation: Pre-oxygenation with 100% oxygen Intubation Type: IV induction Ventilation: Mask ventilation without difficulty Laryngoscope Size: Mac and 3 Grade View: Grade II Tube type: Oral Tube size: 7.5 mm Number of attempts: 1 Airway Equipment and Method: Stylet Placement Confirmation: ETT inserted through vocal cords under direct vision,  positive ETCO2 and breath sounds checked- equal and bilateral Secured at: 22 cm Tube secured with: Tape Dental Injury: Teeth and Oropharynx as per pre-operative assessment

## 2013-08-21 NOTE — Anesthesia Postprocedure Evaluation (Signed)
Anesthesia Post Note  Patient: Chelsey Hoffman  Procedure(s) Performed: Procedure(s) (LRB): LUMBAR LAMINECTOMY FOR TUMOR (N/A)  Anesthesia type: General  Patient location: PACU  Post pain: Pain level controlled  Post assessment: Patient's Cardiovascular Status Stable  Last Vitals:  Filed Vitals:   08/21/13 1000  BP:   Pulse: 90  Temp: 36.7 C  Resp: 11    Post vital signs: Reviewed and stable  Level of consciousness: alert  Complications: No apparent anesthesia complications

## 2013-08-22 NOTE — Progress Notes (Signed)
Subjective: Patient reports She's feeling great legs feel better back pain is well managed  Objective: Vital signs in last 24 hours: Temp:  [97.4 F (36.3 C)-98.6 F (37 C)] 97.8 F (36.6 C) (01/31 0543) Pulse Rate:  [74-90] 74 (01/31 0543) Resp:  [11-18] 18 (01/31 0543) BP: (147-167)/(83-94) 147/84 mmHg (01/31 0543) SpO2:  [97 %-100 %] 99 % (01/31 0543)  Intake/Output from previous day: 01/30 0701 - 01/31 0700 In: 1660 [P.O.:360; I.V.:1300] Out: 1075 [Urine:1000; Blood:75] Intake/Output this shift:    Awake alert strength is 5 out of 5 wound is clean and dry  Lab Results:  Recent Labs  08/19/13 0953  WBC 9.7  HGB 15.2*  HCT 44.4  PLT 262   BMET  Recent Labs  08/19/13 0953  NA 141  K 4.8  CL 102  CO2 28  GLUCOSE 81  BUN 5*  CREATININE 0.64  CALCIUM 9.2    Studies/Results: Dg Lumbar Spine 1 View  08/21/2013   CLINICAL DATA:  L4-L5 laminectomy for tumor removal  EXAM: LUMBAR SPINE - 1 VIEW  COMPARISON:  MRI of the lumbar spine dated July 28, 2013  FINDINGS: The metallic localization device lies over the posterior aspect of the L4-5 facet joint region. This is at the level of the L4-5 disc.  IMPRESSION: The metallic localization device lies posterior to the L4-5 disc level.   Electronically Signed   By: David  Martinique   On: 08/21/2013 11:41    Assessment/Plan: Continue flat bedrest until Monday mobilized per Dr. Annette Stable on Monday  LOS: 1 day     Analissa Bayless P 08/22/2013, 9:29 AM

## 2013-08-23 NOTE — Progress Notes (Signed)
Subjective: Patient reports She's feeling fine no significant leg pain back pain is managed she has noticed some leaking around the catheter.  Objective: Vital signs in last 24 hours: Temp:  [98 F (36.7 C)-98.6 F (37 C)] 98 F (36.7 C) (02/01 0522) Pulse Rate:  [78-85] 78 (02/01 0522) Resp:  [18-20] 20 (02/01 0522) BP: (142-151)/(62-89) 151/77 mmHg (02/01 0522) SpO2:  [94 %-97 %] 95 % (02/01 0522)  Intake/Output from previous day: 01/31 0701 - 02/01 0700 In: 1000 [P.O.:1000] Out: 6400 [Urine:6400] Intake/Output this shift:    Strength is 5 out of 5 wound is clean and dry  Lab Results: No results found for this basename: WBC, HGB, HCT, PLT,  in the last 72 hours BMET No results found for this basename: NA, K, CL, CO2, GLUCOSE, BUN, CREATININE, CALCIUM,  in the last 72 hours  Studies/Results: No results found.  Assessment/Plan: We'll have the nurses check her Foley and reposition or check the balloon. Continue flat bedrest for 24 more hours mobilized tomorrow.  LOS: 2 days     Tia Gelb P 08/23/2013, 9:12 AM

## 2013-08-24 ENCOUNTER — Encounter (HOSPITAL_COMMUNITY): Payer: Self-pay | Admitting: Neurosurgery

## 2013-08-24 NOTE — Progress Notes (Signed)
Postoperative 3. Patient doing very well. No headache. No wound drainage. No significant back pain. No leg pain. No complaints of numbness or weakness.  Afebrile. Vital stable. Wound clean and dry. Motor and sensory intact.  Doing well. Discontinue bedrest. Mobilize today. Possible discharge tomorrow.

## 2013-08-25 MED ORDER — HYDROCODONE-ACETAMINOPHEN 5-325 MG PO TABS: 1.0000 | ORAL_TABLET | ORAL | Status: DC | PRN

## 2013-08-25 MED ORDER — CYCLOBENZAPRINE HCL 10 MG PO TABS: 10.0000 mg | ORAL_TABLET | Freq: Three times a day (TID) | ORAL | Status: DC | PRN

## 2013-08-25 NOTE — Progress Notes (Signed)
Discharge instructions given. Pt verbalized understanding and all questions were answered.  

## 2013-08-25 NOTE — Discharge Instructions (Signed)

## 2013-08-25 NOTE — Discharge Summary (Signed)
Physician Discharge Summary  Patient ID: Chelsey Hoffman MRN: [id number] DOB/AGE: [birth year] 48 y.o.  Admit date: 08/21/2013 Discharge date: 08/25/2013  Admission Diagnoses:  Discharge Diagnoses:  Principal Problem:   Intradural-extramedullary spinal cord neoplasms   Discharged Condition: good  Hospital Course: Patient admitted to the hospital where she underwent uncomplicated L4 and L5 laminectomy and resection of intradural tumor. Tumor consistent with a schwannoma. Pathology still pending. Postoperatively patient is done well. Pain has been role E. No new numbness weakness. Bowel and bladder function intact. Patient has been mobilizing without difficulty. Wound healing well. Ready for discharge home.  Consults:   Significant Diagnostic Studies:   Treatments:   Discharge Exam: Blood pressure 162/91, pulse 63, temperature 98.2 F (36.8 C), temperature source Oral, resp. rate 18, height 5\' 3"  (1.6 m), weight 64.774 kg (142 lb 12.8 oz), last menstrual period 03/21/2013, SpO2 97.00%. Awake and alert. Oriented and appropriate. Motor and sensory function intact. Wound clean and dry. Chest and abdomen benign.  Disposition: Final discharge disposition not confirmed     Medication List    STOP taking these medications       predniSONE 5 MG tablet  Commonly known as:  DELTASONE      TAKE these medications       acetaminophen 325 MG tablet  Commonly known as:  TYLENOL  Take 650 mg by mouth every 6 (six) hours as needed for mild pain.     cyclobenzaprine 10 MG tablet  Commonly known as:  FLEXERIL  Take 1 tablet (10 mg total) by mouth 3 (three) times daily as needed for muscle spasms.     HYDROcodone-acetaminophen 5-325 MG per tablet  Commonly known as:  NORCO/VICODIN  Take 1 tablet by mouth every 6 (six) hours as needed for moderate pain.     HYDROcodone-acetaminophen 5-325 MG per tablet  Commonly known as:  NORCO/VICODIN  Take 1-2 tablets by mouth every 4 (four) hours as  needed for moderate pain.     meloxicam 15 MG tablet  Commonly known as:  MOBIC  Take 15 mg by mouth daily. Not taking         Signed: Delawrence Fridman A 08/25/2013, 12:46 PM

## 2020-09-02 ENCOUNTER — Other Ambulatory Visit: Payer: Self-pay | Admitting: Neurosurgery

## 2020-09-15 NOTE — Progress Notes (Signed)
Surgical Instructions    Your procedure is scheduled on 09/20/20.  Report to [facility] Main Entrance "A" at 06:00 A.M., then check in with the Admitting office.  Call this number if you have problems the morning of surgery:  [phone]   If you have any questions prior to your surgery date call [phone]: Open Monday-Friday 8am-4pm    Remember:  Do not eat or drink after midnight the night before your surgery     Take these medicines the morning of surgery with A SIP OF WATER  acetaminophen (TYLENOL) if needed amLODipine (NORVASC)  levothyroxine (SYNTHROID) metoprolol succinate (TOPROL-XL)  omeprazole (PRILOSEC) rosuvastatin (CRESTOR)  As of today, STOP taking any Aspirin (unless otherwise instructed by your surgeon) Aleve, Naproxen, Ibuprofen, Motrin, Advil, Goody's, BC's, all herbal medications, fish oil, and all vitamins.                     Do not wear jewelry, make up, or nail polish            Do not wear lotions, powders, perfumes/colognes, or deodorant.            Do not shave 48 hours prior to surgery.              Do not bring valuables to the hospital.            [facility] is not responsible for any belongings or valuables.  Do NOT Smoke (Tobacco/Vaping) or drink Alcohol 24 hours prior to your procedure If you use a CPAP at night, you may bring all equipment for your overnight stay.   Contacts, glasses, dentures or bridgework may not be worn into surgery, please bring cases for these belongings   For patients admitted to the hospital, discharge time will be determined by your treatment team.   Patients discharged the day of surgery will not be allowed to drive home, and someone needs to stay with them for 24 hours.    Special instructions:   - Preparing For Surgery  Before surgery, you can play an important role. Because skin is not sterile, your skin needs to be as free of germs as possible. You can reduce the number of germs on your skin by  washing with CHG (chlorahexidine gluconate) Soap before surgery.  CHG is an antiseptic cleaner which kills germs and bonds with the skin to continue killing germs even after washing.    Oral Hygiene is also important to reduce your risk of infection.  Remember - BRUSH YOUR TEETH THE MORNING OF SURGERY WITH YOUR REGULAR TOOTHPASTE  Please do not use if you have an allergy to CHG or antibacterial soaps. If your skin becomes reddened/irritated stop using the CHG.  Do not shave (including legs and underarms) for at least 48 hours prior to first CHG shower. It is OK to shave your face.  Please follow these instructions carefully.   1. Shower the NIGHT BEFORE SURGERY and the MORNING OF SURGERY  2. If you chose to wash your hair, wash your hair first as usual with your normal shampoo.  3. After you shampoo, rinse your hair and body thoroughly to remove the shampoo.  4. Wash Face and genitals (private parts) with your normal soap.   5.  Shower the NIGHT BEFORE SURGERY and the MORNING OF SURGERY with CHG Soap.   6. Use CHG Soap as you would any other liquid soap. You can apply CHG directly to the skin and wash gently  with a scrungie or a clean washcloth.   7. Apply the CHG Soap to your body ONLY FROM THE NECK DOWN.  Do not use on open wounds or open sores. Avoid contact with your eyes, ears, mouth and genitals (private parts). Wash Face and genitals (private parts)  with your normal soap.   8. Wash thoroughly, paying special attention to the area where your surgery will be performed.  9. Thoroughly rinse your body with warm water from the neck down.  10. DO NOT shower/wash with your normal soap after using and rinsing off the CHG Soap.  11. Pat yourself dry with a CLEAN TOWEL.  12. Wear CLEAN PAJAMAS to bed the night before surgery  13. Place CLEAN SHEETS on your bed the night before your surgery  14. DO NOT SLEEP WITH PETS.   Day of Surgery: Take a shower.  Wear Clean/Comfortable  clothing the morning of surgery Do not apply any deodorants/lotions.   Remember to brush your teeth WITH YOUR REGULAR TOOTHPASTE.   Please read over the following fact sheets that you were given.

## 2020-09-16 ENCOUNTER — Encounter (HOSPITAL_COMMUNITY)
Admission: RE | Admit: 2020-09-16 | Discharge: 2020-09-16 | Disposition: A | Discharge status: Home / Self Care | Payer: 59 | Source: Ambulatory Visit | Attending: Neurosurgery | Admitting: Neurosurgery

## 2020-09-16 ENCOUNTER — Encounter (HOSPITAL_COMMUNITY): Payer: Self-pay

## 2020-09-16 ENCOUNTER — Other Ambulatory Visit (HOSPITAL_COMMUNITY)
Admission: RE | Admit: 2020-09-16 | Discharge: 2020-09-16 | Disposition: A | Discharge status: Home / Self Care | Payer: 59 | Source: Ambulatory Visit | Attending: Neurosurgery | Admitting: Neurosurgery

## 2020-09-16 ENCOUNTER — Other Ambulatory Visit: Payer: Self-pay

## 2020-09-16 DIAGNOSIS — Z20822 Contact with and (suspected) exposure to covid-19: Secondary | ICD-10-CM | POA: Insufficient documentation

## 2020-09-16 DIAGNOSIS — Z01812 Encounter for preprocedural laboratory examination: Secondary | ICD-10-CM | POA: Insufficient documentation

## 2020-09-16 HISTORY — DX: Depression, unspecified: F32.A

## 2020-09-16 HISTORY — DX: Unspecified osteoarthritis, unspecified site: M19.90

## 2020-09-16 HISTORY — DX: Dyspnea, unspecified: R06.00

## 2020-09-16 HISTORY — DX: Gastro-esophageal reflux disease without esophagitis: K21.9

## 2020-09-16 HISTORY — DX: Hypothyroidism, unspecified: E03.9

## 2020-09-16 HISTORY — DX: Anxiety disorder, unspecified: F41.9

## 2020-09-16 LAB — CBC WITH DIFFERENTIAL/PLATELET
Abs Immature Granulocytes: 0.01 10*3/uL (ref 0.00–0.07)
Basophils Absolute: 0.1 10*3/uL (ref 0.0–0.1)
Basophils Relative: 1 %
Eosinophils Absolute: 0.1 10*3/uL (ref 0.0–0.5)
Eosinophils Relative: 1 %
HCT: 39.1 % (ref 36.0–46.0)
Hemoglobin: 12.8 g/dL (ref 12.0–15.0)
Immature Granulocytes: 0 %
Lymphocytes Relative: 32 %
Lymphs Abs: 2.1 10*3/uL (ref 0.7–4.0)
MCH: 28.9 pg (ref 26.0–34.0)
MCHC: 32.7 g/dL (ref 30.0–36.0)
MCV: 88.3 fL (ref 80.0–100.0)
Monocytes Absolute: 0.5 10*3/uL (ref 0.1–1.0)
Monocytes Relative: 8 %
Neutro Abs: 3.7 10*3/uL (ref 1.7–7.7)
Neutrophils Relative %: 58 %
Platelets: 346 10*3/uL (ref 150–400)
RBC: 4.43 MIL/uL (ref 3.87–5.11)
RDW: 14.9 % (ref 11.5–15.5)
WBC: 6.4 10*3/uL (ref 4.0–10.5)
nRBC: 0 % (ref 0.0–0.2)

## 2020-09-16 LAB — BASIC METABOLIC PANEL
Anion gap: 8 (ref 5–15)
BUN: 6 mg/dL (ref 6–20)
CO2: 24 mmol/L (ref 22–32)
Calcium: 9.2 mg/dL (ref 8.9–10.3)
Chloride: 104 mmol/L (ref 98–111)
Creatinine, Ser: 0.78 mg/dL (ref 0.44–1.00)
GFR, Estimated: >60 mL/min (ref >60)
Glucose, Bld: 97 mg/dL (ref 70–99)
Potassium: 3.5 mmol/L (ref 3.5–5.1)
Sodium: 136 mmol/L (ref 135–145)

## 2020-09-16 LAB — TYPE AND SCREEN
ABO/RH(D): O POS
Antibody Screen: NEGATIVE

## 2020-09-16 LAB — SURGICAL PCR SCREEN
MRSA, PCR: NEGATIVE
Staphylococcus aureus: NEGATIVE

## 2020-09-16 LAB — SARS CORONAVIRUS 2 (TAT 6-24 HRS): SARS Coronavirus 2: NEGATIVE

## 2020-09-16 NOTE — Progress Notes (Signed)
PCP - Thornton Papas, FNP Cardiologist - denies  PPM/ICD - denies   Chest x-ray - n/a EKG - 09/16/20 Stress Test - over ten years ago, pt states it was normal (gallbladder causes chest discomfort) ECHO - same time as stress test Cardiac Cath - denies  Sleep Study - denies    Patient instructed to hold all Aspirin, NSAID's, herbal medications, fish oil and vitamins 7 days prior to surgery.   ERAS Protcol -no   COVID TEST- 09/16/2020   Anesthesia review: no  Patient denies shortness of breath, fever, cough and chest pain at PAT appointment   All instructions explained to the patient, with a verbal understanding of the material. Patient agrees to go over the instructions while at home for a better understanding. Patient also instructed to self quarantine after being tested for COVID-19. The opportunity to ask questions was provided.

## 2020-09-19 NOTE — Anesthesia Preprocedure Evaluation (Addendum)
Anesthesia Evaluation  Patient identified by MRN, date of birth, ID band Patient awake    Reviewed: Allergy & Precautions, H&P , NPO status , Patient's Chart, lab work & pertinent test results  Airway Mallampati: II  TM Distance: >3 FB Neck ROM: Full    Dental no notable dental hx.    Pulmonary neg pulmonary ROS, Current Smoker and Patient abstained from smoking.,    Pulmonary exam normal breath sounds clear to auscultation       Cardiovascular hypertension, negative cardio ROS Normal cardiovascular exam Rhythm:Regular Rate:Normal     Neuro/Psych PSYCHIATRIC DISORDERS Anxiety Depression Spinal cord neoplasms negative neurological ROS  negative psych ROS   GI/Hepatic negative GI ROS, Neg liver ROS, GERD  ,  Endo/Other  negative endocrine ROSHypothyroidism   Renal/GU negative Renal ROS  negative genitourinary   Musculoskeletal negative musculoskeletal ROS (+) Arthritis ,   Abdominal   Peds negative pediatric ROS (+)  Hematology negative hematology ROS (+)   Anesthesia Other Findings   Reproductive/Obstetrics negative OB ROS                            Anesthesia Physical Anesthesia Plan  ASA: II  Anesthesia Plan: General   Post-op Pain Management:    Induction: Intravenous  PONV Risk Score and Plan: 2 and Scopolamine patch - Pre-op, Treatment may vary due to age or medical condition, Midazolam, Dexamethasone and Ondansetron  Airway Management Planned: Oral ETT  Additional Equipment: None  Intra-op Plan:   Post-operative Plan: Extubation in OR  Informed Consent: I have reviewed the patients History and Physical, chart, labs and discussed the procedure including the risks, benefits and alternatives for the proposed anesthesia with the patient or authorized representative who has indicated his/her understanding and acceptance.       Plan Discussed with: CRNA, Anesthesiologist  and Surgeon  Anesthesia Plan Comments:        Anesthesia Quick Evaluation

## 2020-09-20 ENCOUNTER — Inpatient Hospital Stay (HOSPITAL_COMMUNITY): Payer: 59

## 2020-09-20 ENCOUNTER — Other Ambulatory Visit: Payer: Self-pay

## 2020-09-20 ENCOUNTER — Inpatient Hospital Stay (HOSPITAL_COMMUNITY): Payer: 59 | Admitting: Anesthesiology

## 2020-09-20 ENCOUNTER — Encounter (HOSPITAL_COMMUNITY): Payer: Self-pay | Admitting: Neurosurgery

## 2020-09-20 ENCOUNTER — Encounter (HOSPITAL_COMMUNITY)
Admission: RE | Disposition: A | Discharge status: Home / Self Care | Payer: Self-pay | Source: Home / Self Care | Attending: Neurosurgery

## 2020-09-20 ENCOUNTER — Inpatient Hospital Stay (HOSPITAL_COMMUNITY)
Admission: RE | Admit: 2020-09-20 | Discharge: 2020-09-21 | DRG: 455 | Disposition: A | Discharge status: Home / Self Care | Payer: 59 | Attending: Neurosurgery | Admitting: Neurosurgery

## 2020-09-20 DIAGNOSIS — Z79890 Hormone replacement therapy (postmenopausal): Secondary | ICD-10-CM

## 2020-09-20 DIAGNOSIS — Z9049 Acquired absence of other specified parts of digestive tract: Secondary | ICD-10-CM

## 2020-09-20 DIAGNOSIS — M5136 Other intervertebral disc degeneration, lumbar region: Secondary | ICD-10-CM | POA: Diagnosis present

## 2020-09-20 DIAGNOSIS — M48061 Spinal stenosis, lumbar region without neurogenic claudication: Principal | ICD-10-CM | POA: Diagnosis present

## 2020-09-20 DIAGNOSIS — Z86018 Personal history of other benign neoplasm: Secondary | ICD-10-CM

## 2020-09-20 DIAGNOSIS — Z419 Encounter for procedure for purposes other than remedying health state, unspecified: Secondary | ICD-10-CM

## 2020-09-20 DIAGNOSIS — Z79899 Other long term (current) drug therapy: Secondary | ICD-10-CM

## 2020-09-20 DIAGNOSIS — K219 Gastro-esophageal reflux disease without esophagitis: Secondary | ICD-10-CM | POA: Diagnosis present

## 2020-09-20 DIAGNOSIS — E039 Hypothyroidism, unspecified: Secondary | ICD-10-CM | POA: Diagnosis present

## 2020-09-20 DIAGNOSIS — I10 Essential (primary) hypertension: Secondary | ICD-10-CM | POA: Diagnosis present

## 2020-09-20 DIAGNOSIS — F32A Depression, unspecified: Secondary | ICD-10-CM | POA: Diagnosis present

## 2020-09-20 DIAGNOSIS — F419 Anxiety disorder, unspecified: Secondary | ICD-10-CM | POA: Diagnosis present

## 2020-09-20 DIAGNOSIS — F17210 Nicotine dependence, cigarettes, uncomplicated: Secondary | ICD-10-CM | POA: Diagnosis present

## 2020-09-20 HISTORY — PX: ANTERIOR LATERAL LUMBAR FUSION WITH PERCUTANEOUS SCREW 2 LEVEL: SHX5554

## 2020-09-20 LAB — ABO/RH: ABO/RH(D): O POS

## 2020-09-20 SURGERY — ANTERIOR LATERAL LUMBAR FUSION WITH PERCUTANEOUS SCREW 2 LEVEL
Anesthesia: General | Site: Spine Lumbar

## 2020-09-20 MED ORDER — CHLORHEXIDINE GLUCONATE 0.12 % MT SOLN
15.0000 mL | Freq: Once | OROMUCOSAL | Status: AC
  Administered 2020-09-20: 15 mL via OROMUCOSAL
  Filled 2020-09-20: qty 15

## 2020-09-20 MED ORDER — CEFAZOLIN SODIUM-DEXTROSE 2-4 GM/100ML-% IV SOLN
2.0000 g | INTRAVENOUS | Status: AC
  Administered 2020-09-20: 2 g via INTRAVENOUS
  Filled 2020-09-20: qty 100

## 2020-09-20 MED ORDER — SUCCINYLCHOLINE CHLORIDE 200 MG/10ML IV SOSY
PREFILLED_SYRINGE | INTRAVENOUS | Status: DC | PRN
  Administered 2020-09-20: 170 mg via INTRAVENOUS

## 2020-09-20 MED ORDER — DEXMEDETOMIDINE (PRECEDEX) IN NS 20 MCG/5ML (4 MCG/ML) IV SYRINGE
PREFILLED_SYRINGE | INTRAVENOUS | Status: DC | PRN
  Administered 2020-09-20 (×2): 4 ug via INTRAVENOUS

## 2020-09-20 MED ORDER — LACTATED RINGERS IV SOLN: INTRAVENOUS | Status: DC

## 2020-09-20 MED ORDER — FLEET ENEMA 7-19 GM/118ML RE ENEM: 1.0000 | ENEMA | Freq: Once | RECTAL | Status: DC | PRN

## 2020-09-20 MED ORDER — PROPOFOL 10 MG/ML IV BOLUS
INTRAVENOUS | Status: AC
  Filled 2020-09-20: qty 20

## 2020-09-20 MED ORDER — BISACODYL 10 MG RE SUPP: 10.0000 mg | Freq: Every day | RECTAL | Status: DC | PRN

## 2020-09-20 MED ORDER — LACTATED RINGERS IV SOLN: INTRAVENOUS | Status: DC | PRN

## 2020-09-20 MED ORDER — DIAZEPAM 5 MG PO TABS
5.0000 mg | ORAL_TABLET | Freq: Four times a day (QID) | ORAL | Status: DC | PRN
  Administered 2020-09-20 – 2020-09-21 (×3): 5 mg via ORAL
  Filled 2020-09-20 (×2): qty 1

## 2020-09-20 MED ORDER — OXYCODONE HCL 5 MG PO TABS
10.0000 mg | ORAL_TABLET | ORAL | Status: DC | PRN
  Administered 2020-09-20 – 2020-09-21 (×6): 10 mg via ORAL
  Filled 2020-09-20 (×5): qty 2

## 2020-09-20 MED ORDER — PANTOPRAZOLE SODIUM 40 MG PO TBEC: 40.0000 mg | DELAYED_RELEASE_TABLET | Freq: Every day | ORAL | Status: DC

## 2020-09-20 MED ORDER — ACETAMINOPHEN 325 MG PO TABS
650.0000 mg | ORAL_TABLET | ORAL | Status: DC | PRN
  Administered 2020-09-20 – 2020-09-21 (×2): 650 mg via ORAL
  Filled 2020-09-20 (×2): qty 2

## 2020-09-20 MED ORDER — OXYCODONE HCL 5 MG PO TABS: 5.0000 mg | ORAL_TABLET | Freq: Once | ORAL | Status: DC | PRN

## 2020-09-20 MED ORDER — ONDANSETRON HCL 4 MG/2ML IJ SOLN
INTRAMUSCULAR | Status: AC
  Filled 2020-09-20: qty 2

## 2020-09-20 MED ORDER — OXYCODONE HCL 5 MG/5ML PO SOLN: 5.0000 mg | Freq: Once | ORAL | Status: DC | PRN

## 2020-09-20 MED ORDER — HYDROMORPHONE HCL 1 MG/ML IJ SOLN: 1.0000 mg | INTRAMUSCULAR | Status: DC | PRN

## 2020-09-20 MED ORDER — ONDANSETRON HCL 4 MG/2ML IJ SOLN: 4.0000 mg | Freq: Four times a day (QID) | INTRAMUSCULAR | Status: DC | PRN

## 2020-09-20 MED ORDER — EPHEDRINE SULFATE-NACL 50-0.9 MG/10ML-% IV SOSY
PREFILLED_SYRINGE | INTRAVENOUS | Status: DC | PRN
  Administered 2020-09-20: 5 mg via INTRAVENOUS
  Administered 2020-09-20: 10 mg via INTRAVENOUS
  Administered 2020-09-20: 5 mg via INTRAVENOUS

## 2020-09-20 MED ORDER — CEFAZOLIN SODIUM-DEXTROSE 1-4 GM/50ML-% IV SOLN
1.0000 g | Freq: Three times a day (TID) | INTRAVENOUS | Status: AC
  Administered 2020-09-20 (×2): 1 g via INTRAVENOUS
  Filled 2020-09-20 (×2): qty 50

## 2020-09-20 MED ORDER — METOPROLOL SUCCINATE ER 25 MG PO TB24: 25.0000 mg | ORAL_TABLET | Freq: Every day | ORAL | Status: DC

## 2020-09-20 MED ORDER — CHLORHEXIDINE GLUCONATE CLOTH 2 % EX PADS: 6.0000 | MEDICATED_PAD | Freq: Once | CUTANEOUS | Status: DC

## 2020-09-20 MED ORDER — OXYCODONE HCL 5 MG PO TABS
ORAL_TABLET | ORAL | Status: AC
  Filled 2020-09-20: qty 2

## 2020-09-20 MED ORDER — ONDANSETRON HCL 4 MG/2ML IJ SOLN
INTRAMUSCULAR | Status: DC | PRN
  Administered 2020-09-20: 4 mg via INTRAVENOUS

## 2020-09-20 MED ORDER — HYDROCODONE-ACETAMINOPHEN 10-325 MG PO TABS: 1.0000 | ORAL_TABLET | ORAL | Status: DC | PRN

## 2020-09-20 MED ORDER — LEVOTHYROXINE SODIUM 75 MCG PO TABS
75.0000 ug | ORAL_TABLET | Freq: Every day | ORAL | Status: DC
  Administered 2020-09-21: 75 ug via ORAL
  Filled 2020-09-20: qty 1

## 2020-09-20 MED ORDER — PROMETHAZINE HCL 25 MG/ML IJ SOLN: 6.2500 mg | INTRAMUSCULAR | Status: DC | PRN

## 2020-09-20 MED ORDER — HYDROMORPHONE HCL 1 MG/ML IJ SOLN
INTRAMUSCULAR | Status: AC
  Filled 2020-09-20: qty 1

## 2020-09-20 MED ORDER — ASCORBIC ACID 500 MG PO TABS
500.0000 mg | ORAL_TABLET | Freq: Every day | ORAL | Status: DC
  Administered 2020-09-20: 500 mg via ORAL
  Filled 2020-09-20 (×2): qty 1

## 2020-09-20 MED ORDER — SODIUM CHLORIDE 0.9% FLUSH
3.0000 mL | Freq: Two times a day (BID) | INTRAVENOUS | Status: DC
  Administered 2020-09-20: 3 mL via INTRAVENOUS

## 2020-09-20 MED ORDER — PHENOL 1.4 % MT LIQD: 1.0000 | OROMUCOSAL | Status: DC | PRN

## 2020-09-20 MED ORDER — HYDROMORPHONE HCL 1 MG/ML IJ SOLN
0.2500 mg | INTRAMUSCULAR | Status: DC | PRN
  Administered 2020-09-20 (×4): 0.5 mg via INTRAVENOUS

## 2020-09-20 MED ORDER — SODIUM CHLORIDE 0.9% FLUSH: 3.0000 mL | INTRAVENOUS | Status: DC | PRN

## 2020-09-20 MED ORDER — POLYETHYLENE GLYCOL 3350 17 G PO PACK
17.0000 g | PACK | Freq: Every day | ORAL | Status: DC | PRN
  Administered 2020-09-20: 17 g via ORAL
  Filled 2020-09-20: qty 1

## 2020-09-20 MED ORDER — PROPOFOL 10 MG/ML IV BOLUS
INTRAVENOUS | Status: DC | PRN
  Administered 2020-09-20: 50 mg via INTRAVENOUS
  Administered 2020-09-20: 100 mg via INTRAVENOUS
  Administered 2020-09-20: 50 mg via INTRAVENOUS

## 2020-09-20 MED ORDER — LIDOCAINE 2% (20 MG/ML) 5 ML SYRINGE
INTRAMUSCULAR | Status: AC
  Filled 2020-09-20: qty 5

## 2020-09-20 MED ORDER — ADULT MULTIVITAMIN W/MINERALS CH
1.0000 | ORAL_TABLET | Freq: Every day | ORAL | Status: DC
  Administered 2020-09-20: 1 via ORAL
  Filled 2020-09-20: qty 1

## 2020-09-20 MED ORDER — DICLOFENAC SODIUM 50 MG PO TBEC
50.0000 mg | DELAYED_RELEASE_TABLET | Freq: Every day | ORAL | Status: DC | PRN
  Filled 2020-09-20: qty 1

## 2020-09-20 MED ORDER — FENTANYL CITRATE (PF) 250 MCG/5ML IJ SOLN
INTRAMUSCULAR | Status: AC
  Filled 2020-09-20: qty 5

## 2020-09-20 MED ORDER — LIDOCAINE 2% (20 MG/ML) 5 ML SYRINGE
INTRAMUSCULAR | Status: DC | PRN
  Administered 2020-09-20: 100 mg via INTRAVENOUS

## 2020-09-20 MED ORDER — ROCURONIUM BROMIDE 10 MG/ML (PF) SYRINGE
PREFILLED_SYRINGE | INTRAVENOUS | Status: AC
  Filled 2020-09-20: qty 10

## 2020-09-20 MED ORDER — GLYCOPYRROLATE PF 0.2 MG/ML IJ SOSY
PREFILLED_SYRINGE | INTRAMUSCULAR | Status: DC | PRN
  Administered 2020-09-20: .2 mg via INTRAVENOUS

## 2020-09-20 MED ORDER — BUPIVACAINE HCL (PF) 0.25 % IJ SOLN
INTRAMUSCULAR | Status: DC | PRN
  Administered 2020-09-20: 30 mL

## 2020-09-20 MED ORDER — ACETAMINOPHEN 500 MG PO TABS
1000.0000 mg | ORAL_TABLET | Freq: Once | ORAL | Status: AC
  Administered 2020-09-20: 1000 mg via ORAL
  Filled 2020-09-20: qty 2

## 2020-09-20 MED ORDER — ALBUTEROL SULFATE HFA 108 (90 BASE) MCG/ACT IN AERS
INHALATION_SPRAY | RESPIRATORY_TRACT | Status: DC | PRN
  Administered 2020-09-20: 10 via RESPIRATORY_TRACT

## 2020-09-20 MED ORDER — SODIUM CHLORIDE 0.9 % IV SOLN: 250.0000 mL | INTRAVENOUS | Status: DC

## 2020-09-20 MED ORDER — SCOPOLAMINE 1 MG/3DAYS TD PT72
1.0000 | MEDICATED_PATCH | TRANSDERMAL | Status: DC
  Administered 2020-09-20: 1.5 mg via TRANSDERMAL
  Filled 2020-09-20: qty 1

## 2020-09-20 MED ORDER — DEXAMETHASONE SODIUM PHOSPHATE 10 MG/ML IJ SOLN
INTRAMUSCULAR | Status: AC
  Filled 2020-09-20: qty 1

## 2020-09-20 MED ORDER — DIAZEPAM 5 MG PO TABS
ORAL_TABLET | ORAL | Status: AC
  Filled 2020-09-20: qty 1

## 2020-09-20 MED ORDER — FENTANYL CITRATE (PF) 100 MCG/2ML IJ SOLN
INTRAMUSCULAR | Status: DC | PRN
  Administered 2020-09-20 (×2): 25 ug via INTRAVENOUS
  Administered 2020-09-20 (×2): 50 ug via INTRAVENOUS

## 2020-09-20 MED ORDER — MIDAZOLAM HCL 5 MG/5ML IJ SOLN
INTRAMUSCULAR | Status: DC | PRN
  Administered 2020-09-20: 2 mg via INTRAVENOUS

## 2020-09-20 MED ORDER — AMLODIPINE BESYLATE 5 MG PO TABS: 10.0000 mg | ORAL_TABLET | Freq: Every day | ORAL | Status: DC

## 2020-09-20 MED ORDER — BUPIVACAINE HCL (PF) 0.25 % IJ SOLN
INTRAMUSCULAR | Status: AC
  Filled 2020-09-20: qty 30

## 2020-09-20 MED ORDER — DEXAMETHASONE SODIUM PHOSPHATE 10 MG/ML IJ SOLN
10.0000 mg | Freq: Once | INTRAMUSCULAR | Status: AC
  Administered 2020-09-20: 10 mg via INTRAVENOUS

## 2020-09-20 MED ORDER — MENTHOL 3 MG MT LOZG: 1.0000 | LOZENGE | OROMUCOSAL | Status: DC | PRN

## 2020-09-20 MED ORDER — MIDAZOLAM HCL 2 MG/2ML IJ SOLN
INTRAMUSCULAR | Status: AC
  Filled 2020-09-20: qty 2

## 2020-09-20 MED ORDER — ONDANSETRON HCL 4 MG PO TABS: 4.0000 mg | ORAL_TABLET | Freq: Four times a day (QID) | ORAL | Status: DC | PRN

## 2020-09-20 MED ORDER — ORAL CARE MOUTH RINSE: 15.0000 mL | Freq: Once | OROMUCOSAL | Status: AC

## 2020-09-20 MED ORDER — AMISULPRIDE (ANTIEMETIC) 5 MG/2ML IV SOLN: 10.0000 mg | Freq: Once | INTRAVENOUS | Status: DC | PRN

## 2020-09-20 MED ORDER — VITAMIN D 25 MCG (1000 UNIT) PO TABS
2000.0000 [IU] | ORAL_TABLET | Freq: Every day | ORAL | Status: DC
  Administered 2020-09-20: 2000 [IU] via ORAL
  Filled 2020-09-20: qty 2

## 2020-09-20 MED ORDER — 0.9 % SODIUM CHLORIDE (POUR BTL) OPTIME
TOPICAL | Status: DC | PRN
  Administered 2020-09-20 (×2): 1000 mL

## 2020-09-20 MED ORDER — ACETAMINOPHEN 650 MG RE SUPP: 650.0000 mg | RECTAL | Status: DC | PRN

## 2020-09-20 MED ORDER — ROSUVASTATIN CALCIUM 5 MG PO TABS: 5.0000 mg | ORAL_TABLET | Freq: Every day | ORAL | Status: DC

## 2020-09-20 SURGICAL SUPPLY — 54 items
BAG DECANTER FOR FLEXI CONT (MISCELLANEOUS) IMPLANT
BENZOIN TINCTURE PRP APPL 2/3 (GAUZE/BANDAGES/DRESSINGS) ×2 IMPLANT
BLADE CLIPPER SURG (BLADE) IMPLANT
BONE MATRIX OSTEOCEL PRO MED (Bone Implant) ×4 IMPLANT
BUR MATCHSTICK NEURO 3.0 LAGG (BURR) IMPLANT
CAGE MODULUS XL 10X18X55 - 10 (Cage) ×2 IMPLANT
CAGE MODULUS XL 12X18X50 - 10 (Cage) ×2 IMPLANT
CARTRIDGE OIL MAESTRO DRILL (MISCELLANEOUS) IMPLANT
COVER BACK TABLE 60X90IN (DRAPES) ×2 IMPLANT
COVER WAND RF STERILE (DRAPES) IMPLANT
DERMABOND ADVANCED (GAUZE/BANDAGES/DRESSINGS) ×2
DERMABOND ADVANCED .7 DNX12 (GAUZE/BANDAGES/DRESSINGS) ×2 IMPLANT
DIFFUSER DRILL AIR PNEUMATIC (MISCELLANEOUS) IMPLANT
DRAPE C-ARM 42X72 X-RAY (DRAPES) ×2 IMPLANT
DRAPE C-ARMOR (DRAPES) ×2 IMPLANT
DRAPE LAPAROTOMY 100X72X124 (DRAPES) ×2 IMPLANT
DRAPE SURG 17X23 STRL (DRAPES) ×4 IMPLANT
DRSG OPSITE POSTOP 4X6 (GAUZE/BANDAGES/DRESSINGS) ×6 IMPLANT
DURAPREP 26ML APPLICATOR (WOUND CARE) ×2 IMPLANT
ELECT REM PT RETURN 9FT ADLT (ELECTROSURGICAL) ×2
ELECTRODE REM PT RTRN 9FT ADLT (ELECTROSURGICAL) ×1 IMPLANT
GAUZE 4X4 16PLY RFD (DISPOSABLE) IMPLANT
GLOVE BIO SURGEON STRL SZ 6.5 (GLOVE) ×6 IMPLANT
GLOVE ECLIPSE 9.0 STRL (GLOVE) ×2 IMPLANT
GLOVE EXAM NITRILE XL STR (GLOVE) IMPLANT
GLOVE SURG UNDER POLY LF SZ6.5 (GLOVE) ×6 IMPLANT
GOWN STRL REUS W/ TWL LRG LVL3 (GOWN DISPOSABLE) ×2 IMPLANT
GOWN STRL REUS W/ TWL XL LVL3 (GOWN DISPOSABLE) ×1 IMPLANT
GOWN STRL REUS W/TWL 2XL LVL3 (GOWN DISPOSABLE) IMPLANT
GOWN STRL REUS W/TWL LRG LVL3 (GOWN DISPOSABLE) ×2
GOWN STRL REUS W/TWL XL LVL3 (GOWN DISPOSABLE) ×1
GUIDEWIRE NITINOL BEVEL TIP (WIRE) ×6 IMPLANT
KIT BASIN OR (CUSTOM PROCEDURE TRAY) ×2 IMPLANT
KIT DILATOR XLIF 5 (KITS) ×1 IMPLANT
KIT SURGICAL ACCESS MAXCESS 4 (KITS) ×2 IMPLANT
KIT TURNOVER KIT B (KITS) ×2 IMPLANT
KIT XLIF (KITS) ×1
MODULE NVM5 NEXT GEN EMG (NEEDLE) ×2 IMPLANT
NEEDLE HYPO 22GX1.5 SAFETY (NEEDLE) ×2 IMPLANT
NEEDLE I-PASS III (NEEDLE) ×2 IMPLANT
NS IRRIG 1000ML POUR BTL (IV SOLUTION) ×2 IMPLANT
OIL CARTRIDGE MAESTRO DRILL (MISCELLANEOUS)
PACK LAMINECTOMY NEURO (CUSTOM PROCEDURE TRAY) ×2 IMPLANT
ROD RELINE MAS LORD 5.5X75MM (Rod) ×2 IMPLANT
SCREW LOCK RELINE 5.5 TULIP (Screw) ×6 IMPLANT
SCREW MAS RELINE POLY 6.5X40 (Screw) ×6 IMPLANT
SPONGE LAP 4X18 RFD (DISPOSABLE) IMPLANT
STRIP CLOSURE SKIN 1/2X4 (GAUZE/BANDAGES/DRESSINGS) ×2 IMPLANT
SUT VIC AB 2-0 CT1 18 (SUTURE) ×4 IMPLANT
SUT VIC AB 3-0 SH 8-18 (SUTURE) ×4 IMPLANT
TOWEL GREEN STERILE (TOWEL DISPOSABLE) ×2 IMPLANT
TOWEL GREEN STERILE FF (TOWEL DISPOSABLE) ×2 IMPLANT
TRAY FOLEY MTR SLVR 16FR STAT (SET/KITS/TRAYS/PACK) ×2 IMPLANT
WATER STERILE IRR 1000ML POUR (IV SOLUTION) ×2 IMPLANT

## 2020-09-20 NOTE — Transfer of Care (Signed)
Immediate Anesthesia Transfer of Care Note  Patient: Chelsey Hoffman  Procedure(s) Performed: EXTREME LUMBAR INTERBODY FUSION LUMBAR THREE-FOUR,LUMBAR FOUR-FIVE WITH PERCUTANEOUS SCREWS. (N/A Spine Lumbar)  Patient Location: PACU  Anesthesia Type:General  Level of Consciousness: drowsy and patient cooperative  Airway & Oxygen Therapy: Patient Spontanous Breathing  Post-op Assessment: Report given to RN and Post -op Vital signs reviewed and stable  Post vital signs: Reviewed and stable  Last Vitals:  Vitals Value Taken Time  BP 119/68 09/20/20 1059  Temp 36.7 C 09/20/20 1043  Pulse 54 09/20/20 1102  Resp 10 09/20/20 1102  SpO2 98 % 09/20/20 1102  Vitals shown include unvalidated device data.  Last Pain:  Vitals:   09/20/20 1059  TempSrc:   PainSc: Asleep      Patients Stated Pain Goal: 3 (22/29/79 8921)  Complications: No complications documented.

## 2020-09-20 NOTE — Brief Op Note (Signed)
09/20/2020  10:12 AM  PATIENT:  Chelsey Hoffman  55 y.o. female  PRE-OPERATIVE DIAGNOSIS:  Stenosis  POST-OPERATIVE DIAGNOSIS:  Stenosis  PROCEDURE:  Procedure(s) with comments: EXTREME LUMBAR INTERBODY FUSION LUMBAR THREE-FOUR,LUMBAR FOUR-FIVE WITH PERCUTANEOUS SCREWS. (N/A) - anterolateral  SURGEON:  Surgeon(s) and Role:    * Earnie Larsson, MD - Primary  PHYSICIAN ASSISTANT:   ASSISTANTSMearl Latin   ANESTHESIA:   general  EBL:  100 mL   BLOOD ADMINISTERED:none  DRAINS: none   LOCAL MEDICATIONS USED:  MARCAINE     SPECIMEN:  No Specimen  DISPOSITION OF SPECIMEN:  N/A  COUNTS:  YES  TOURNIQUET:  * No tourniquets in log *  DICTATION: .Dragon Dictation  PLAN OF CARE: Admit to inpatient   PATIENT DISPOSITION:  PACU - hemodynamically stable.   Delay start of Pharmacological VTE agent (>24hrs) due to surgical blood loss or risk of bleeding: yes

## 2020-09-20 NOTE — Evaluation (Signed)
Physical Therapy Evaluation Patient Details Name: Chelsey Hoffman MRN: [id number] DOB: 1966/02/03 Today's Date: 09/20/2020   History of Present Illness  Pt is a 55 y/o female s/p L3-5 ALIF. PMH includes HTN, spinal cord tumor s/p laminectomy.  Clinical Impression  Patient is s/p above surgery resulting in the deficits listed below (see PT Problem List). Pt limited this session secondary to pain. Requiring min to min guard A for mobility tasks. Educated about back precautions and generalized walking program. Patient will benefit from skilled PT to increase their independence and safety with mobility (while adhering to their precautions) to allow discharge to the venue listed below.     Follow Up Recommendations No PT follow up    Equipment Recommendations  Rolling walker with 5" wheels;3in1 (PT)    Recommendations for Other Services       Precautions / Restrictions Precautions Precautions: Back Precaution Booklet Issued: Yes (comment) Precaution Comments: Reviewed back precautions with pt. Required Braces or Orthoses: Spinal Brace Spinal Brace: Lumbar corset Restrictions Weight Bearing Restrictions: No      Mobility  Bed Mobility Overal bed mobility: Needs Assistance Bed Mobility: Rolling;Sidelying to Sit;Sit to Sidelying Rolling: Min guard Sidelying to sit: Min guard     Sit to sidelying: Min guard General bed mobility comments: Min guard to ensure log roll technique. Cues for sequencing.    Transfers Overall transfer level: Needs assistance Equipment used: 1 person hand held assist Transfers: Sit to/from Stand Sit to Stand: Min assist         General transfer comment: Min A for lift assist and steadying.  Ambulation/Gait Ambulation/Gait assistance: Min guard Gait Distance (Feet): 100 Feet Assistive device: 1 person hand held assist;IV Pole Gait Pattern/deviations: Step-through pattern;Decreased stride length Gait velocity: Decreased   General Gait Details:  Very guarded gait. Distance limited secondary to pain. Holding to PT hand and IV pole for support. Educated about generalized walking program to perform at home.  Stairs            Wheelchair Mobility    Modified Rankin (Stroke Patients Only)       Balance Overall balance assessment: Needs assistance Sitting-balance support: No upper extremity supported;Feet supported Sitting balance-Leahy Scale: Good     Standing balance support: Bilateral upper extremity supported;No upper extremity supported Standing balance-Leahy Scale: Fair Standing balance comment: Able to maintain standing balance without UE support                             Pertinent Vitals/Pain Pain Assessment: Faces Faces Pain Scale: Hurts whole lot Pain Location: bilateral hip and groin area; back Pain Descriptors / Indicators: Aching;Operative site guarding Pain Intervention(s): Limited activity within patient's tolerance;Monitored during session;Repositioned    Home Living Family/patient expects to be discharged to:: Private residence Living Arrangements: Alone Available Help at Discharge: Family;Available 24 hours/day (reports son plans to stay with her) Type of Home: House Home Access: Stairs to enter Entrance Stairs-Rails: None Entrance Stairs-Number of Steps: 1 Home Layout: One level Home Equipment: None      Prior Function Level of Independence: Independent               Hand Dominance        Extremity/Trunk Assessment   Upper Extremity Assessment Upper Extremity Assessment: Defer to OT evaluation    Lower Extremity Assessment Lower Extremity Assessment: Generalized weakness (reports bilateral hip/groin pain)    Cervical / Trunk Assessment Cervical / Trunk Assessment:  Other exceptions Cervical / Trunk Exceptions: s/p lumbar surgery  Communication   Communication: No difficulties  Cognition Arousal/Alertness: Awake/alert Behavior During Therapy: WFL for tasks  assessed/performed Overall Cognitive Status: Within Functional Limits for tasks assessed                                        General Comments General comments (skin integrity, edema, etc.): Pt's son present during session    Exercises     Assessment/Plan    PT Assessment Patient needs continued PT services  PT Problem List Decreased strength;Decreased balance;Decreased mobility;Decreased activity tolerance;Decreased knowledge of use of DME;Decreased knowledge of precautions;Pain       PT Treatment Interventions Gait training;DME instruction;Functional mobility training;Stair training;Therapeutic activities;Therapeutic exercise;Balance training;Patient/family education    PT Goals (Current goals can be found in the Care Plan section)  Acute Rehab PT Goals Patient Stated Goal: to decrease pain PT Goal Formulation: With patient Time For Goal Achievement: 10/04/20 Potential to Achieve Goals: Good    Frequency Min 5X/week   Barriers to discharge        Co-evaluation               AM-PAC PT "6 Clicks" Mobility  Outcome Measure Help needed turning from your back to your side while in a flat bed without using bedrails?: A Little Help needed moving from lying on your back to sitting on the side of a flat bed without using bedrails?: A Little Help needed moving to and from a bed to a chair (including a wheelchair)?: A Little Help needed standing up from a chair using your arms (e.g., wheelchair or bedside chair)?: A Little Help needed to walk in hospital room?: A Little Help needed climbing 3-5 steps with a railing? : A Little 6 Click Score: 18    End of Session Equipment Utilized During Treatment: Back brace Activity Tolerance: Patient limited by pain Patient left: in bed;with call bell/phone within reach;with family/visitor present   PT Visit Diagnosis: Other abnormalities of gait and mobility (R26.89);Pain Pain - part of body:  (back)    Time:  8984-2103 PT Time Calculation (min) (ACUTE ONLY): 17 min   Charges:   PT Evaluation $PT Eval Low Complexity: 1 Low          Lou Miner, DPT  Acute Rehabilitation Services  Pager: [phone] Office: [phone]   Rudean Hitt 09/20/2020, 2:51 PM

## 2020-09-20 NOTE — Anesthesia Procedure Notes (Signed)
Procedure Name: Intubation Date/Time: 09/20/2020 8:11 AM Performed by: Georgia Duff, CRNA Pre-anesthesia Checklist: Patient identified, Emergency Drugs available, Suction available and Patient being monitored Patient Re-evaluated:Patient Re-evaluated prior to induction Oxygen Delivery Method: Circle System Utilized Preoxygenation: Pre-oxygenation with 100% oxygen Induction Type: IV induction Ventilation: Mask ventilation without difficulty Laryngoscope Size: Miller and 2 Grade View: Grade II Tube type: Oral Tube size: 7.0 mm Number of attempts: 1 Airway Equipment and Method: Stylet and Oral airway Placement Confirmation: ETT inserted through vocal cords under direct vision,  positive ETCO2 and breath sounds checked- equal and bilateral Secured at: 22 cm Tube secured with: Tape Dental Injury: Teeth and Oropharynx as per pre-operative assessment

## 2020-09-20 NOTE — Op Note (Signed)
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  Asst. Surgeon: Reinaldo Meeker, NP  Anesthesia: General  Indication: 55 year old female status post prior L4-5 laminectomy and resection of intradural, extra medullary tumor with good results.  She presents now with worsening back and right lower extremity pain.  Work-up demonstrates evidence of marked disc degeneration with disc base collapse, angulation and some lateral listhesis at L3-4 and severe disc space collapse with foraminal stenosis at L4-5.  Patient is failed conservative management.  She presents now for two-level lumbar decompression and fusion.  Operative note: After induction of anesthesia, patient positioned in the right lateral decubitus position and appropriately padded.  Patient was positioned in the bed and secured and the bed was flexed and L3-4 and L4-5 was localized laterally.  The left flank and lumbar region prepped and draped sterilely.  Incision was made over the inferior aspect of the body of L4.  Secondary incision was made in the left flank.  Using blunt dissection through the secondary incision access was gained to the retroperitoneal space.  Peritoneal sac and left kidney were protected.  A dilator was then passed into the retroperitoneal space through the lateral incision and docked on the disc space of L4-L5.  Position was found to be good with fluoroscopy.  Neuro monitoring was used and stimulation confirmed no direct contact to the adjacent lumbar plexus.  A K wire was then passed in the disc space  at L4-5.  The dilators were sequentially increased to each time with intraoperative stimulation and neural monitoring to confirm safe passage.  Self-retaining retractor was placed and partially opened.  The disc base was identified.  Direct stimulation was performed and again no neural structures were identified or directly adjacent.  A shim was then passed into the disc space at L4-5.  The retractor was widened.  Disc space then incised.  Discectomy then performed using various instruments.  Lateral release was performed using Cobb elevators.  To space was sequentially dilated and a 10 mm lordotic implant was found to be most appropriate.  A 10 mm x 55 mm titanium 3D printed cage packed with osteocell plus was then impacted into place.  Position was found be good in both the AP and lateral plane.  The applier was removed.  Hemostasis was ensured.  The retractor was removed.  Attention was then placed to the L3-4 space which was accessed and stimulated in a similar fashion.  Again the disc space was protected with retractor.  The spaces incised.  Discectomy then performed.  To space prepared for interbody fusion including with contralateral release.  To space was sized to a 12 mm implant.  A 12 mm lordotic 3D printed cage packed with osteocell +50 mm in width was then impacted into place and found to be in good position both the AP and lateral plane.  Again the inserter and retractors were removed.  The bed was returned to a neutral position.  The pedicles of L3-L4 and L5 were identified on the left side.  Incisions were made overlying the pedicles.  The pedicles were then accessed using a Jamshidi needle introducer under fluoroscopic guidance and using direct neural stimulation and monitoring to  confirm safe passage.  Pedicles were entered and passage was made into the vertebral body at L3-L4 and L5.  K wires were placed.  For 6.5 mm NuVasive screws were placed at L3-L4 and L5.  Trajectory and position were  confirmed to be good.  A lordotic precut rod was then passed through the screw towers and secured into place.  Locking caps were then applied.  Locking caps were then secured and given a final tightening.  The towers were removed.  Final images reveal good position the cages and the hardware at the proper operative level with normal alignment of the spine.  Wounds were then irrigated and then closed in typical fashion.  Steri-Strips and sterile dressing were applied.  Marcaine was infiltrated into the soft tissues.

## 2020-09-20 NOTE — H&P (Signed)
Chelsey Hoffman is an 55 y.o. female.   Chief Complaint: Right leg pain HPI: 55 year old female with significant lumbar pain with radiation to her right lower extremity which has been ongoing for the past few years and has failed extensive conservative management.  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  Work-up now demonstrates evidence of progressive disc degeneration with disc space collapse and foraminal stenosis right greater than left at L3-4 and L4-5.  Patient presents now for two-level anterior lateral decompression and fusion with percutaneous pedicle screw fixation in hopes of improving her symptoms.  Past Medical History:  Diagnosis Date  . Anxiety   . Arthritis    bilateral hands  . Depression   . Dyspnea   . Ganglion cyst of wrist   . GERD (gastroesophageal reflux disease)   . Hypertension    gestational  . Hypothyroidism   . Spinal cord tumor     Past Surgical History:  Procedure Laterality Date  . BREAST BIOPSY Right    benign  . BREAST SURGERY    . CHOLECYSTECTOMY  1995  . LAMINECTOMY N/A 08/21/2013   Procedure: LUMBAR LAMINECTOMY FOR TUMOR;  Surgeon: Charlie Pitter, MD;  Location: [facility];  Service: Neurosurgery;  Laterality: N/A;  LUMBAR LAMINECTOMY FOR TUMOR  . TUBAL LIGATION  1998    History reviewed. No pertinent family history. Social History:  reports that she has been smoking cigarettes. She has a 15.00 pack-year smoking history. She has never used smokeless tobacco. She reports that she does not drink alcohol and does not use drugs.  Allergies: No Known Allergies  Medications Prior to Admission  Medication Sig Dispense Refill  . acetaminophen (TYLENOL) 325 MG tablet Take 650 mg by mouth every 6 (six) hours as needed for mild pain.    Chelsey Hoffman Kitchen amLODipine (NORVASC) 10 MG tablet Take 10 mg by mouth daily.    . Aspirin-Acetaminophen-Caffeine  (GOODYS EXTRA STRENGTH) [phone] MG PACK Take 1 packet by mouth 2 (two) times daily as needed (pain).    . Cholecalciferol (VITAMIN D3) 50 MCG (2000 UT) TABS Take 2,000 Units by mouth daily.    . diclofenac (VOLTAREN) 50 MG EC tablet Take 50 mg by mouth daily.    Chelsey Hoffman Kitchen levothyroxine (SYNTHROID) 75 MCG tablet Take 75 mcg by mouth daily.    . metoprolol succinate (TOPROL-XL) 25 MG 24 hr tablet Take 25 mg by mouth daily.    . Multiple Vitamin (MULTIVITAMIN WITH MINERALS) TABS tablet Take 1 tablet by mouth daily.    Chelsey Hoffman Kitchen omeprazole (PRILOSEC) 20 MG capsule Take 20 mg by mouth daily.    . rosuvastatin (CRESTOR) 5 MG tablet Take 5 mg by mouth daily.    . vitamin C (ASCORBIC ACID) 500 MG tablet Take 500 mg by mouth daily.      Results for orders placed or performed during the hospital encounter of 09/20/20 (from the past 48 hour(s))  ABO/Rh     Status: None   Collection Time: 09/20/20  6:56 AM  Result Value Ref Range   ABO/RH(D)      O POS Performed at [facility] [address] [place], [place] [address]    No results found.  Pertinent items noted in HPI and remainder of comprehensive ROS otherwise negative.  Blood pressure (!) 166/91, pulse (!) 59, temperature 98.3 F (36.8 C), temperature source Oral, resp. rate 17, height 5'  2" (1.575 m), weight 56.7 kg, last menstrual period 03/21/2013, SpO2 100 %.  Patient is awake and alert.  She is oriented and appropriate.  Speech is fluent.  Judgment insight are intact.  Cranial nerve function normal bilateral.  Motor examination of the extremities reveals some mild weakness in her right quadriceps muscle group otherwise motor strength intact.  Sensory examination with decrease sensation pinprick and light touch in her right L4 dermatome.  Deep tender versus normal active.  No evidence of long track signs.  Gait is antalgic.  Posture is normal peer examination head ears eyes nose and throat is unremarked.  Chest and abdomen are benign.  Extremities  are free from injury or deformity. Assessment/Plan L3-4, L4-5 degenerative disc disease with disc base collapse and foraminal stenosis and persistent radiculopathy.  Plan left-sided L3-4, L4-5 anterior lateral retroperitoneal interbody decompression and fusion utilizing interbody cage and morselized allograft.  This be further supplemented with posterior percutaneous pedicle screw fixation.  Risks and benefits been explained.  Patient wishes to proceed.  Mallie Mussel A [name] 09/20/2020, 7:51 AM

## 2020-09-21 ENCOUNTER — Encounter (HOSPITAL_COMMUNITY): Payer: Self-pay | Admitting: Neurosurgery

## 2020-09-21 MED ORDER — CYCLOBENZAPRINE HCL 10 MG PO TABS: 10.0000 mg | ORAL_TABLET | Freq: Three times a day (TID) | ORAL | 0 refills | Status: AC | PRN

## 2020-09-21 MED ORDER — HYDROCODONE-ACETAMINOPHEN 10-325 MG PO TABS: 1.0000 | ORAL_TABLET | ORAL | 0 refills | Status: AC | PRN

## 2020-09-21 NOTE — Discharge Summary (Signed)
Physician Discharge Summary  Patient ID: Chelsey Hoffman MRN: [id number] DOB/AGE: 1965-11-06 55 y.o.  Admit date: 09/20/2020 Discharge date: 09/21/2020  Admission Diagnoses:  Discharge Diagnoses:  Active Problems:   Lumbar foraminal stenosis   Discharged Condition: good  Hospital Course: Patient been to the hospital where she underwent uncomplicated lumbar decompression and fusion surgery at L3-4 and L4-5.  Postop Chelsey Hoffman doing very well.  Ambulating without difficulty.  Strength intact.  Wound is healing well.  Abdomen soft.  Ready for discharge home.onsults:   Significant Diagnostic Studies:   Treatments:   Discharge Exam: Blood pressure 115/75, pulse 64, temperature 98.5 F (36.9 C), temperature source Oral, resp. rate 16, height 5\' 2"  (1.575 m), weight 56.7 kg, last menstrual period 03/21/2013, SpO2 98 %. Awake and alert.  Oriented and appropriate.  Speech fluent.  Judgment insight intact.  Motor and sensory function intact.  Wound clean and dry.  Chest and abdomen benign.  Disposition: Discharge disposition: 01-Home or Self Care        Allergies as of 09/21/2020   No Known Allergies     Medication List    TAKE these medications   acetaminophen 325 MG tablet Commonly known as: TYLENOL Take 650 mg by mouth every 6 (six) hours as needed for mild pain.   amLODipine 10 MG tablet Commonly known as: NORVASC Take 10 mg by mouth daily.   cyclobenzaprine 10 MG tablet Commonly known as: FLEXERIL Take 1 tablet (10 mg total) by mouth 3 (three) times daily as needed for muscle spasms.   diclofenac 50 MG EC tablet Commonly known as: VOLTAREN Take 50 mg by mouth daily.   [employer]ys Extra Strength [id number] MG Pack Generic drug: Aspirin-Acetaminophen-Caffeine Take 1 packet by mouth 2 (two) times daily as needed (pain).   HYDROcodone-acetaminophen 10-325 MG tablet Commonly known as: NORCO Take 1 tablet by mouth every 4 (four) hours as needed for moderate pain ((score 4 to 6)).    levothyroxine 75 MCG tablet Commonly known as: SYNTHROID Take 75 mcg by mouth daily.   metoprolol succinate 25 MG 24 hr tablet Commonly known as: TOPROL-XL Take 25 mg by mouth daily.   multivitamin with minerals Tabs tablet Take 1 tablet by mouth daily.   omeprazole 20 MG capsule Commonly known as: PRILOSEC Take 20 mg by mouth daily.   rosuvastatin 5 MG tablet Commonly known as: CRESTOR Take 5 mg by mouth daily.   vitamin C 500 MG tablet Commonly known as: ASCORBIC ACID Take 500 mg by mouth daily.   Vitamin D3 50 MCG (2000 UT) Tabs Take 2,000 Units by mouth daily.            Durable Medical Equipment  (From admission, onward)         Start     Ordered   09/20/20 1216  DME Walker rolling  Once       Question:  Patient needs a walker to treat with the following condition  Answer:  Lumbar foraminal stenosis   09/20/20 1215   09/20/20 1216  DME 3 n 1  Once        09/20/20 1215           Signed: Cooper Render Yessenia Maillet 09/21/2020, 8:33 AM

## 2020-09-21 NOTE — Discharge Instructions (Signed)

## 2020-09-21 NOTE — Progress Notes (Signed)
Occupational Therapy Evaluation  Patient lives home alone typically, however is having son stay with her a few days. Pt lives [facility] however lives on main level with 1 STE and is I with self care at baseline. Pt educated on back precautions and techniques for maintaining during ADL/IADL tasks. Pt verbalize understanding and return demo during LB dressing and g/h tasks at sink side without physical assist. Education completed with no further acute OT needs at this time. Please re-consult if new needs arise.    09/21/20 0900  OT Visit Information  Last OT Received On 09/21/20  Assistance Needed +1  History of Present Illness Pt is a 55 y/o female s/p L3-5 ALIF. PMH includes HTN, spinal cord tumor s/p laminectomy.  Precautions  Precautions Back  Precaution Booklet Issued Yes (comment) ((received handout from PT))  Precaution Comments Reviewed back precautions with pt.  Required Braces or Orthoses Spinal Brace  Spinal Brace Lumbar corset  Restrictions  Weight Bearing Restrictions No   Home Living  Family/patient expects to be discharged to: Private residence  Living Arrangements Alone  Available Help at Discharge Family;Available 24 hours/day (son plans to stay with her at D/C)  Type of [place] to enter  Entrance Stairs-Number of Steps 1  Entrance Stairs-Rails None  Home Layout One level  Bathroom Shower/Tub Tub/shower unit  Automotive engineer None  Prior Function  Level of Independence Independent  Communication  Communication No difficulties  Pain Assessment  Pain Assessment Faces  Faces Pain Scale 6  Pain Location bilateral hip and groin area; back  Pain Descriptors / Indicators Aching;Operative site guarding  Pain Intervention(s) Monitored during session  Cognition  Arousal/Alertness Awake/alert  Behavior During Therapy WFL for tasks assessed/performed  Overall Cognitive Status Within Functional Limits for tasks assessed  Upper  Extremity Assessment  Upper Extremity Assessment Overall WFL for tasks assessed  Lower Extremity Assessment  Lower Extremity Assessment Defer to PT evaluation  ADL  Overall ADL's  Modified independent  General ADL Comments educated patient on compensatory strategies in order to maintain back precautions during ADLs. Pt verbalize understanding and return demo with LB dressing and oral care at sink  Bed Mobility  Overal bed mobility Modified Independent  General bed mobility comments demonstrates adequate log roll technique getting OOB  Transfers  Overall transfer level Independent  General transfer comment no physical assistance to power up to standing  Balance  Overall balance assessment Mild deficits observed, not formally tested  OT - End of Session  Activity Tolerance Patient tolerated treatment well  Patient left in bed;with call bell/phone within reach  Nurse Communication Mobility status  OT Assessment  OT Recommendation/Assessment Patient does not need any further OT services  OT Visit Diagnosis Pain  Pain - part of body Hip (back)  OT Problem List Pain;Decreased activity tolerance  AM-PAC OT "6 Clicks" Daily Activity Outcome Measure (Version 2)  Help from another person eating meals? 4  Help from another person taking care of personal grooming? 4  Help from another person toileting, which includes using toliet, bedpan, or urinal? 4  Help from another person bathing (including washing, rinsing, drying)? 4  Help from another person to put on and taking off regular upper body clothing? 4  Help from another person to put on and taking off regular lower body clothing? 4  6 Click Score 24  OT Recommendation  Follow Up Recommendations No OT follow up  OT Equipment None recommended by OT  Acute Rehab OT Goals  Patient Stated Goal home today  OT Goal Formulation All assessment and education complete, DC therapy  OT Time Calculation  OT Start Time (ACUTE ONLY) 0826  OT Stop  Time (ACUTE ONLY) 2725  OT Time Calculation (min) 12 min  OT General Charges  $OT Visit 1 Visit  OT Evaluation  $OT Eval Low Complexity 1 Low  Written Expression  Dominant Hand Right   Delbert Phenix OT OT pager: [phone]

## 2020-09-21 NOTE — Anesthesia Postprocedure Evaluation (Signed)
Anesthesia Post Note  Patient: Chelsey Hoffman  Procedure(s) Performed: EXTREME LUMBAR INTERBODY FUSION LUMBAR THREE-FOUR,LUMBAR FOUR-FIVE WITH PERCUTANEOUS SCREWS. (N/A Spine Lumbar)     Patient location during evaluation: PACU Anesthesia Type: General Level of consciousness: awake Pain management: pain level controlled Vital Signs Assessment: post-procedure vital signs reviewed and stable Respiratory status: spontaneous breathing and respiratory function stable Cardiovascular status: stable Postop Assessment: no apparent nausea or vomiting Anesthetic complications: no   No complications documented.  Last Vitals:  Vitals:   09/21/20 0337 09/21/20 0746  BP: 123/69 115/75  Pulse: (!) 53 64  Resp: 16 16  Temp: 36.8 C 36.9 C  SpO2: 98% 98%    Last Pain:  Vitals:   09/21/20 0746  TempSrc: Oral  PainSc:                  [name]

## 2020-09-21 NOTE — Progress Notes (Signed)
Physical Therapy Treatment Patient Details Name: Chelsey Hoffman MRN: [id number] DOB: [birth year] Today's Date: 09/21/2020    History of Present Illness Pt is a 55 y/o female s/p L3-5 ALIF. PMH includes HTN, spinal cord tumor s/p laminectomy.    PT Comments    Pt progressing well with post-op mobility. She was able to demonstrate transfers and ambulation with gross supervision for safety. Pt utilizing RW for support in the hallway. Pt was educated on precautions, brace application/wearing schedule, appropriate activity progression, and car transfer. Will continue to follow.      Follow Up Recommendations  No PT follow up     Equipment Recommendations  Rolling walker with 5" wheels;3in1 (PT)    Recommendations for Other Services       Precautions / Restrictions Precautions Precautions: Back Precaution Booklet Issued: Yes (comment) Precaution Comments: Reviewed back precautions with pt. Required Braces or Orthoses: Spinal Brace Spinal Brace: Lumbar corset Restrictions Weight Bearing Restrictions: No    Mobility  Bed Mobility Overal bed mobility: Modified Independent             General bed mobility comments: Pt was received sitting up in recliner.    Transfers Overall transfer level: Needs assistance Equipment used: None Transfers: Sit to/from Stand Sit to Stand: Supervision         General transfer comment: No assist required. Pt was able to transition to/from standing without the RW.  Ambulation/Gait Ambulation/Gait assistance: Supervision Gait Distance (Feet): 200 Feet Assistive device: Rolling walker (2 wheeled) Gait Pattern/deviations: Step-through pattern;Decreased stride length Gait velocity: Decreased Gait velocity interpretation: <1.31 ft/sec, indicative of household ambulator General Gait Details: Slow and guarded however overall steady with RW for support.   Stairs             Wheelchair Mobility    Modified Rankin (Stroke Patients  Only)       Balance Overall balance assessment: Needs assistance Sitting-balance support: No upper extremity supported;Feet supported Sitting balance-Leahy Scale: Good     Standing balance support: Bilateral upper extremity supported;No upper extremity supported Standing balance-Leahy Scale: Fair Standing balance comment: Able to maintain standing balance without UE support                            Cognition Arousal/Alertness: Awake/alert Behavior During Therapy: WFL for tasks assessed/performed Overall Cognitive Status: Within Functional Limits for tasks assessed                                        Exercises      General Comments        Pertinent Vitals/Pain Pain Assessment: Faces Faces Pain Scale: Hurts little more Pain Location: bilateral hip and groin area; back Pain Descriptors / Indicators: Aching;Operative site guarding Pain Intervention(s): Limited activity within patient's tolerance;Monitored during session;Repositioned    Home Living Family/patient expects to be discharged to:: Private residence Living Arrangements: Alone Available Help at Discharge: Family;Available 24 hours/day (son plans to stay with her at D/C) Type of Home: House Home Access: Stairs to enter Entrance Stairs-Rails: None Home Layout: One level Home Equipment: None      Prior Function Level of Independence: Independent          PT Goals (current goals can now be found in the care plan section) Acute Rehab PT Goals Patient Stated Goal: to decrease pain PT Goal Formulation:  With patient Time For Goal Achievement: 10/04/20 Potential to Achieve Goals: Good Progress towards PT goals: Progressing toward goals    Frequency    Min 5X/week      PT Plan Current plan remains appropriate    Co-evaluation              AM-PAC PT "6 Clicks" Mobility   Outcome Measure  Help needed turning from your back to your side while in a flat bed without  using bedrails?: None Help needed moving from lying on your back to sitting on the side of a flat bed without using bedrails?: None Help needed moving to and from a bed to a chair (including a wheelchair)?: None Help needed standing up from a chair using your arms (e.g., wheelchair or bedside chair)?: None Help needed to walk in hospital room?: A Little Help needed climbing 3-5 steps with a railing? : A Little 6 Click Score: 22    End of Session Equipment Utilized During Treatment: Back brace Activity Tolerance: Patient limited by pain Patient left: in bed;with call bell/phone within reach;with family/visitor present   PT Visit Diagnosis: Other abnormalities of gait and mobility (R26.89);Pain Pain - part of body:  (back)     Time: 7014-1030 PT Time Calculation (min) (ACUTE ONLY): 13 min  Charges:  $Gait Training: 8-22 mins                     Chelsey Hoffman, PT, DPT Acute Rehabilitation Services Pager: [phone] Office: [phone]    Chelsey Hoffman 09/21/2020, 9:19 AM

## 2020-09-21 NOTE — Progress Notes (Signed)
Pt doing well. Pt given D/C instructions with verbal understanding. Rx's were sent to the pharmacy by MD. Pt's incision is clean and dry with no sign of infection. Pt's IV was removed prior to D/C. Pt D/c'd home via wheelchair per MD order. Pt received RW and 3-n-1 from [employer] prior to D/C. Pt is stable @ D/C and has no other needs at this time. Holli Humbles, RN

## 2022-06-09 IMAGING — RF DG LUMBAR SPINE 2-3V
1 series · 2 of 2 positions shown · non-contrast
Comparison: MRI 09/22/2019

CLINICAL DATA: Evaluate for retained foreign body

EXAM:
LUMBAR SPINE - 2-3 VIEW; DG C-ARM 1-60 MIN

[Series 1: run · 2 of 2 slices shown]
[im 1/2]
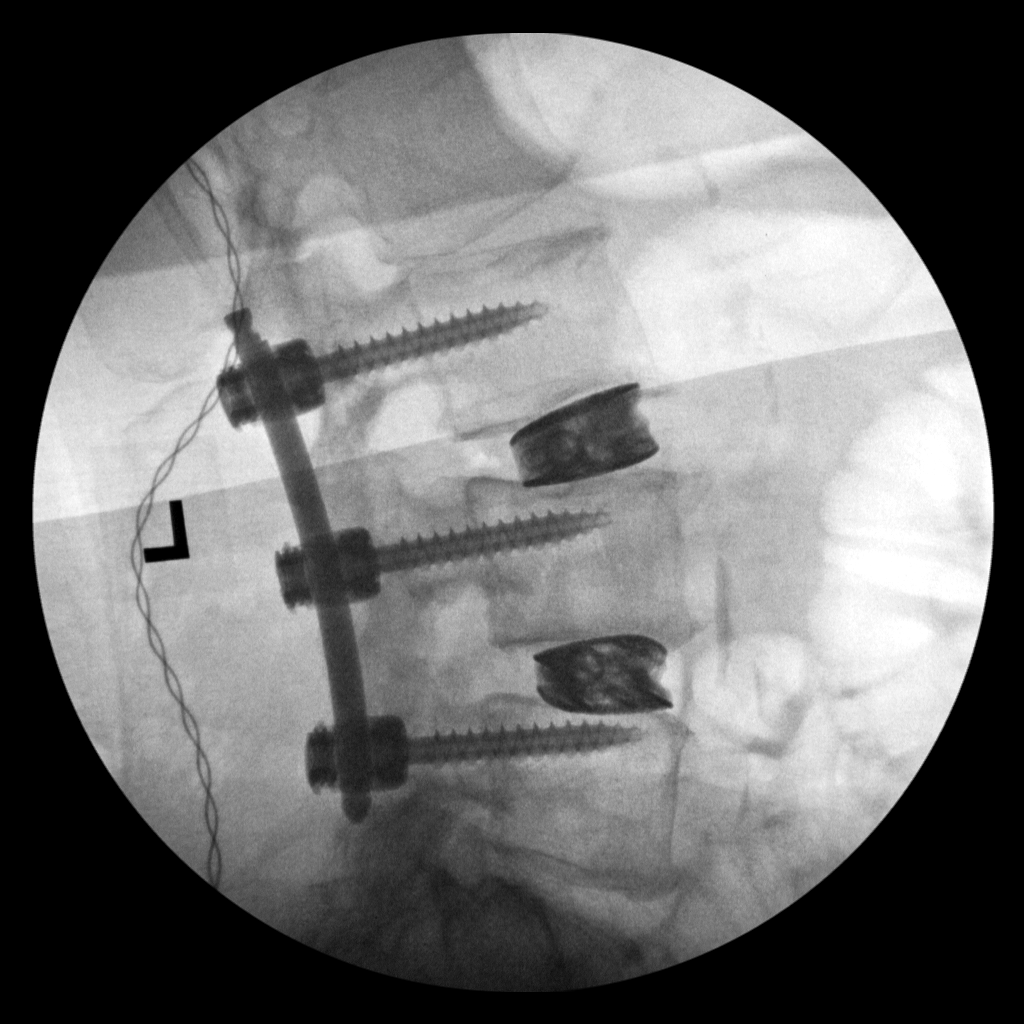
[im 2/2]
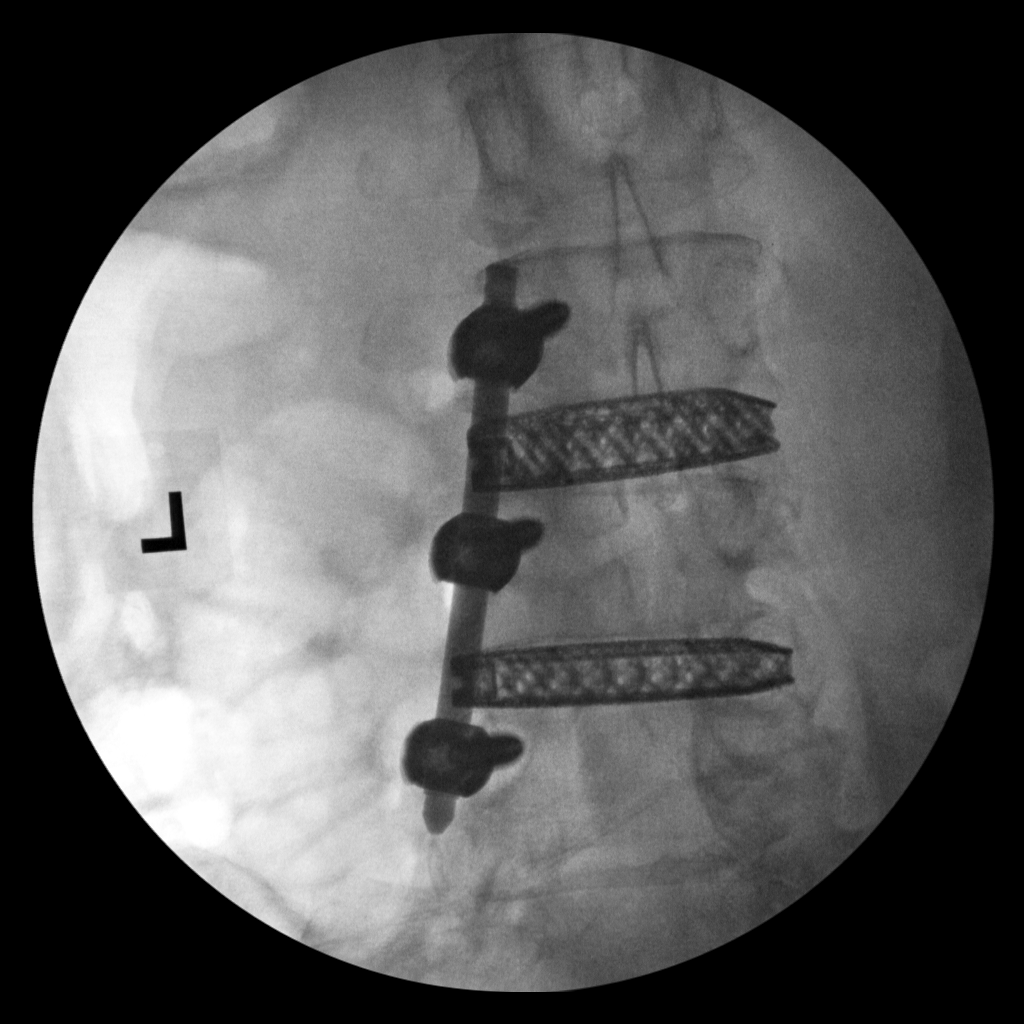

[2 of 2 positions shown; findings below may reference images not displayed]

FINDINGS: Two images of the lumbar spine were obtained via C-arm radiography
in the operating room. Images show unilateral pedicle screw and rod
fixation of the lumbar spine at L3 through L5. Prosthetic discs are
identified within L3-4 and L4-5 disc space. No unintentional
retained foreign body.
IMPRESSION: No unintentional retained foreign body identified.

These results were called by telephone at the time of interpretation
on 09/20/2020 at [DATE] to OR [HOSPITAL] and the staff verbally
acknowledged these results.

## 2022-06-09 IMAGING — RF DG C-ARM 1-60 MIN
1 series · 2 of 2 positions shown · non-contrast
Comparison: MRI 09/22/2019

CLINICAL DATA: Evaluate for retained foreign body

EXAM:
LUMBAR SPINE - 2-3 VIEW; DG C-ARM 1-60 MIN

[Series 1: run · 2 of 2 slices shown]
[im 1/2]
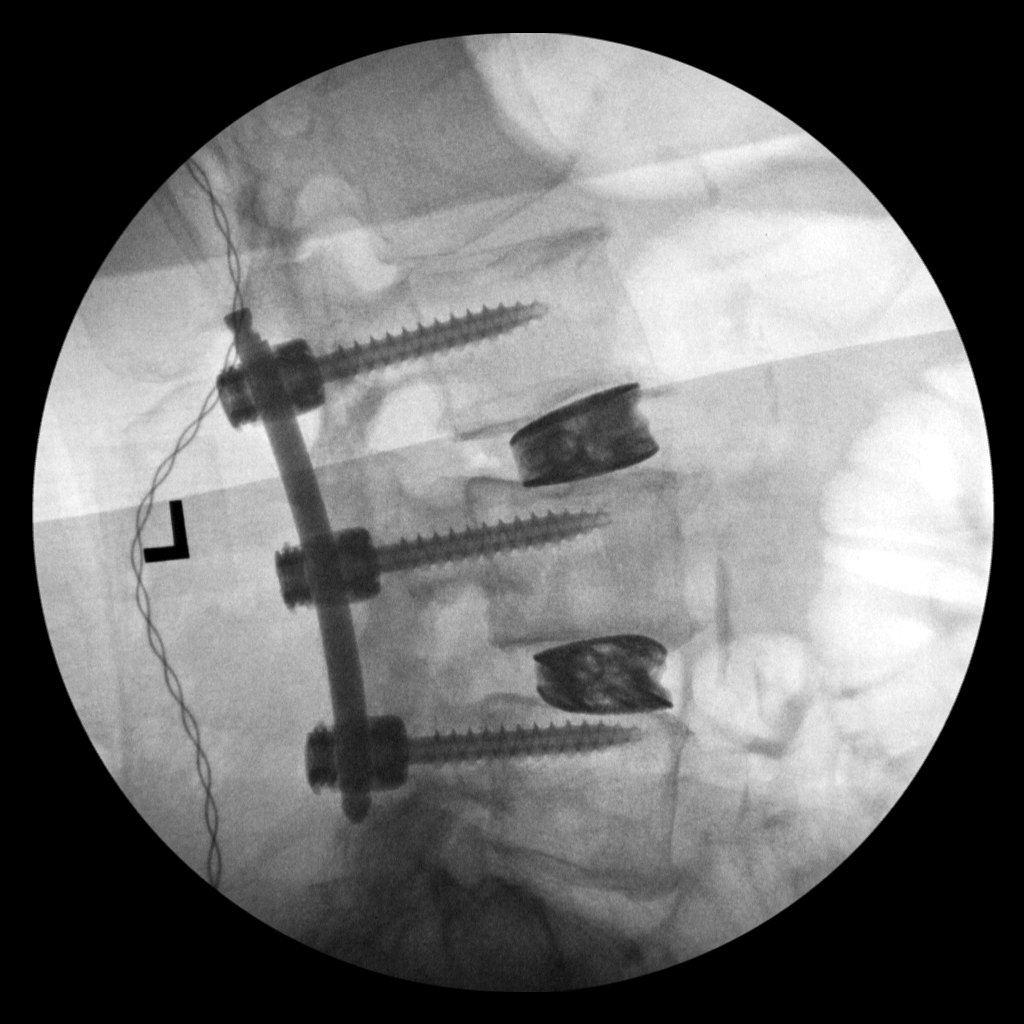
[im 2/2]
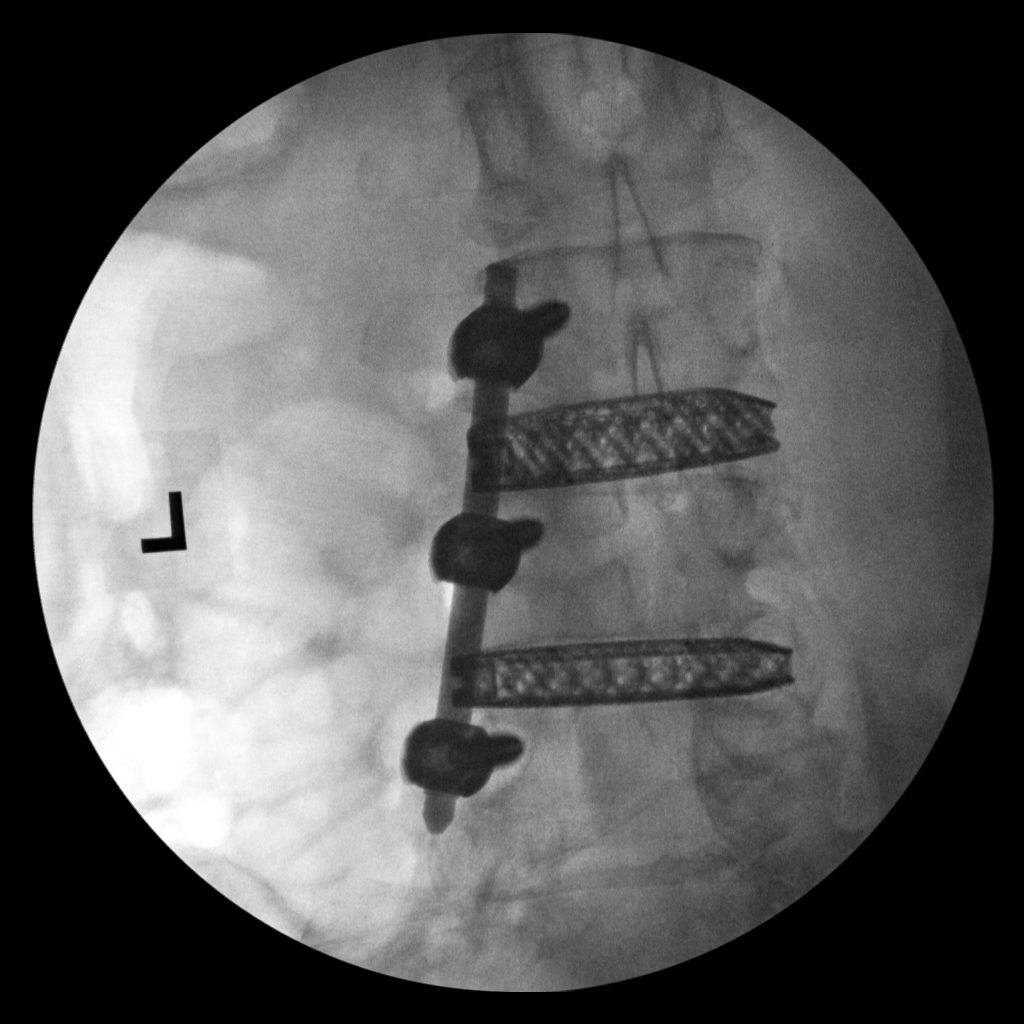

[2 of 2 positions shown; findings below may reference images not displayed]

FINDINGS: Two images of the lumbar spine were obtained via C-arm radiography
in the operating room. Images show unilateral pedicle screw and rod
fixation of the lumbar spine at L3 through L5. Prosthetic discs are
identified within L3-4 and L4-5 disc space. No unintentional
retained foreign body.
IMPRESSION: No unintentional retained foreign body identified.

These results were called by telephone at the time of interpretation
on 09/20/2020 at [DATE] to OR [HOSPITAL] and the staff verbally
acknowledged these results.
